# Patient Record
Sex: Male | Born: 1945 | Race: White | Hispanic: No | Marital: Single | Smoking: Former smoker
Health system: Southern US, Community
[De-identification: ages and names within clinical notes are randomized; demographics above are authoritative.]

## PROBLEM LIST (undated history)

## (undated) DIAGNOSIS — Z720 Tobacco use: Secondary | ICD-10-CM

## (undated) DIAGNOSIS — IMO0002 Reserved for concepts with insufficient information to code with codable children: Secondary | ICD-10-CM

## (undated) DIAGNOSIS — D696 Thrombocytopenia, unspecified: Secondary | ICD-10-CM

## (undated) DIAGNOSIS — I69354 Hemiplegia and hemiparesis following cerebral infarction affecting left non-dominant side: Secondary | ICD-10-CM

## (undated) DIAGNOSIS — I69991 Dysphagia following unspecified cerebrovascular disease: Secondary | ICD-10-CM

## (undated) DIAGNOSIS — I639 Cerebral infarction, unspecified: Secondary | ICD-10-CM

## (undated) DIAGNOSIS — F1010 Alcohol abuse, uncomplicated: Secondary | ICD-10-CM

## (undated) DIAGNOSIS — R531 Weakness: Secondary | ICD-10-CM

## (undated) DIAGNOSIS — W19XXXA Unspecified fall, initial encounter: Secondary | ICD-10-CM

## (undated) DIAGNOSIS — E785 Hyperlipidemia, unspecified: Secondary | ICD-10-CM

## (undated) DIAGNOSIS — J449 Chronic obstructive pulmonary disease, unspecified: Secondary | ICD-10-CM

## (undated) DIAGNOSIS — E876 Hypokalemia: Secondary | ICD-10-CM

## (undated) HISTORY — DX: Hypokalemia: E87.6

## (undated) HISTORY — DX: Tobacco use: Z72.0

## (undated) HISTORY — DX: Alcohol abuse, uncomplicated: F10.10

## (undated) HISTORY — DX: Chronic obstructive pulmonary disease, unspecified: J44.9

## (undated) HISTORY — DX: Thrombocytopenia, unspecified: D69.6

## (undated) HISTORY — DX: Unspecified fall, initial encounter: W19.XXXA

## (undated) HISTORY — PX: FINGER AMPUTATION: SHX636

## (undated) HISTORY — PX: FOOT SURGERY: SHX648

## (undated) HISTORY — DX: Dysphagia following unspecified cerebrovascular disease: I69.991

## (undated) HISTORY — DX: Hemiplegia and hemiparesis following cerebral infarction affecting left non-dominant side: I69.354

## (undated) HISTORY — DX: Reserved for concepts with insufficient information to code with codable children: IMO0002

## (undated) HISTORY — DX: Cerebral infarction, unspecified: I63.9

## (undated) HISTORY — DX: Weakness: R53.1

## (undated) HISTORY — DX: Hyperlipidemia, unspecified: E78.5

---

## 2002-06-20 ENCOUNTER — Encounter: Payer: Self-pay | Admitting: *Deleted

## 2002-06-20 ENCOUNTER — Emergency Department (HOSPITAL_COMMUNITY): Admission: EM | Admit: 2002-06-20 | Discharge: 2002-06-20 | Payer: Self-pay | Admitting: *Deleted

## 2002-06-29 ENCOUNTER — Emergency Department (HOSPITAL_COMMUNITY): Admission: EM | Admit: 2002-06-29 | Discharge: 2002-06-29 | Payer: Self-pay | Admitting: Emergency Medicine

## 2004-08-18 ENCOUNTER — Inpatient Hospital Stay (HOSPITAL_COMMUNITY): Admission: EM | Admit: 2004-08-18 | Discharge: 2004-08-22 | Payer: Self-pay | Admitting: Emergency Medicine

## 2006-05-24 ENCOUNTER — Emergency Department (HOSPITAL_COMMUNITY): Admission: EM | Admit: 2006-05-24 | Discharge: 2006-05-24 | Payer: Self-pay | Admitting: Emergency Medicine

## 2006-10-17 ENCOUNTER — Emergency Department (HOSPITAL_COMMUNITY): Admission: EM | Admit: 2006-10-17 | Discharge: 2006-10-17 | Payer: Self-pay | Admitting: Family Medicine

## 2014-08-02 ENCOUNTER — Ambulatory Visit: Payer: Self-pay | Admitting: Gastroenterology

## 2014-08-04 LAB — PATHOLOGY REPORT

## 2015-10-19 ENCOUNTER — Emergency Department (HOSPITAL_COMMUNITY): Payer: Medicare HMO

## 2015-10-19 ENCOUNTER — Emergency Department (HOSPITAL_COMMUNITY)
Admission: EM | Admit: 2015-10-19 | Discharge: 2015-10-19 | Disposition: A | Discharge status: Home / Self Care | Payer: Medicare HMO | Attending: Emergency Medicine | Admitting: Emergency Medicine

## 2015-10-19 ENCOUNTER — Encounter (HOSPITAL_COMMUNITY): Payer: Self-pay

## 2015-10-19 DIAGNOSIS — Z880 Allergy status to penicillin: Secondary | ICD-10-CM | POA: Diagnosis not present

## 2015-10-19 DIAGNOSIS — S0083XA Contusion of other part of head, initial encounter: Secondary | ICD-10-CM | POA: Diagnosis not present

## 2015-10-19 DIAGNOSIS — J441 Chronic obstructive pulmonary disease with (acute) exacerbation: Secondary | ICD-10-CM | POA: Diagnosis not present

## 2015-10-19 DIAGNOSIS — R55 Syncope and collapse: Secondary | ICD-10-CM | POA: Diagnosis not present

## 2015-10-19 DIAGNOSIS — J069 Acute upper respiratory infection, unspecified: Secondary | ICD-10-CM | POA: Diagnosis not present

## 2015-10-19 DIAGNOSIS — W01198A Fall on same level from slipping, tripping and stumbling with subsequent striking against other object, initial encounter: Secondary | ICD-10-CM | POA: Diagnosis not present

## 2015-10-19 DIAGNOSIS — Y9389 Activity, other specified: Secondary | ICD-10-CM | POA: Diagnosis not present

## 2015-10-19 DIAGNOSIS — Y998 Other external cause status: Secondary | ICD-10-CM | POA: Diagnosis not present

## 2015-10-19 DIAGNOSIS — F17210 Nicotine dependence, cigarettes, uncomplicated: Secondary | ICD-10-CM | POA: Diagnosis not present

## 2015-10-19 DIAGNOSIS — R5383 Other fatigue: Secondary | ICD-10-CM | POA: Diagnosis present

## 2015-10-19 DIAGNOSIS — Y92009 Unspecified place in unspecified non-institutional (private) residence as the place of occurrence of the external cause: Secondary | ICD-10-CM | POA: Insufficient documentation

## 2015-10-19 LAB — CBC WITH DIFFERENTIAL/PLATELET
Basophils Absolute: 0 K/uL (ref 0.0–0.1)
Basophils Relative: 0 %
Eosinophils Absolute: 0 K/uL (ref 0.0–0.7)
Eosinophils Relative: 0 %
HCT: 40.8 % (ref 39.0–52.0)
Hemoglobin: 14.9 g/dL (ref 13.0–17.0)
Lymphocytes Relative: 10 %
Lymphs Abs: 0.9 K/uL (ref 0.7–4.0)
MCH: 38.9 pg — ABNORMAL HIGH (ref 26.0–34.0)
MCHC: 36.5 g/dL — ABNORMAL HIGH (ref 30.0–36.0)
MCV: 106.5 fL — ABNORMAL HIGH (ref 78.0–100.0)
Monocytes Absolute: 0.5 K/uL (ref 0.1–1.0)
Monocytes Relative: 6 %
Neutro Abs: 7.5 K/uL (ref 1.7–7.7)
Neutrophils Relative %: 84 %
Platelets: 171 K/uL (ref 150–400)
RBC: 3.83 MIL/uL — ABNORMAL LOW (ref 4.22–5.81)
RDW: 12.5 % (ref 11.5–15.5)
WBC: 8.9 K/uL (ref 4.0–10.5)

## 2015-10-19 LAB — I-STAT TROPONIN, ED: Troponin i, poc: 0.01 ng/mL (ref 0.00–0.08)

## 2015-10-19 LAB — BASIC METABOLIC PANEL WITH GFR
Anion gap: 9 (ref 5–15)
BUN: <5 mg/dL — ABNORMAL LOW (ref 6–20)
CO2: 30 mmol/L (ref 22–32)
Calcium: 8.8 mg/dL — ABNORMAL LOW (ref 8.9–10.3)
Chloride: 87 mmol/L — ABNORMAL LOW (ref 101–111)
Creatinine, Ser: 0.83 mg/dL (ref 0.61–1.24)
GFR calc Af Amer: >60 mL/min
GFR calc non Af Amer: >60 mL/min
Glucose, Bld: 145 mg/dL — ABNORMAL HIGH (ref 65–99)
Potassium: 3.6 mmol/L (ref 3.5–5.1)
Sodium: 126 mmol/L — ABNORMAL LOW (ref 135–145)

## 2015-10-19 MED ORDER — ONDANSETRON HCL 4 MG/2ML IJ SOLN
4.0000 mg | Freq: Once | INTRAMUSCULAR | Status: AC
  Administered 2015-10-19: 4 mg via INTRAVENOUS
  Filled 2015-10-19: qty 2

## 2015-10-19 MED ORDER — AZITHROMYCIN 250 MG PO TABS: 250.0000 mg | ORAL_TABLET | Freq: Every day | ORAL | Status: DC

## 2015-10-19 MED ORDER — ALBUTEROL SULFATE HFA 108 (90 BASE) MCG/ACT IN AERS: 1.0000 | INHALATION_SPRAY | Freq: Four times a day (QID) | RESPIRATORY_TRACT | Status: DC | PRN

## 2015-10-19 MED ORDER — PREDNISONE 10 MG PO TABS: 20.0000 mg | ORAL_TABLET | Freq: Every day | ORAL | Status: DC

## 2015-10-19 MED ORDER — IPRATROPIUM-ALBUTEROL 0.5-2.5 (3) MG/3ML IN SOLN
3.0000 mL | Freq: Once | RESPIRATORY_TRACT | Status: AC
  Administered 2015-10-19: 3 mL via RESPIRATORY_TRACT
  Filled 2015-10-19: qty 3

## 2015-10-19 MED ORDER — SODIUM CHLORIDE 0.9 % IV BOLUS (SEPSIS)
1000.0000 mL | Freq: Once | INTRAVENOUS | Status: AC
  Administered 2015-10-19: 1000 mL via INTRAVENOUS

## 2015-10-19 NOTE — Discharge Instructions (Signed)
Upper Respiratory Infection, Adult Most upper respiratory infections (URIs) are a viral infection of the air passages leading to the lungs. A URI affects the nose, throat, and upper air passages. The most common type of URI is nasopharyngitis and is typically referred to as "the common cold." URIs run their course and usually go away on their own. Most of the time, a URI does not require medical attention, but sometimes a bacterial infection in the upper airways can follow a viral infection. This is called a secondary infection. Sinus and middle ear infections are common types of secondary upper respiratory infections. Bacterial pneumonia can also complicate a URI. A URI can worsen asthma and chronic obstructive pulmonary disease (COPD). Sometimes, these complications can require emergency medical care and may be life threatening.  CAUSES Almost all URIs are caused by viruses. A virus is a type of germ and can spread from one person to another.  RISKS FACTORS You may be at risk for a URI if:   You smoke.   You have chronic heart or lung disease.  You have a weakened defense (immune) system.   You are very young or very old.   You have nasal allergies or asthma.  You work in crowded or poorly ventilated areas.  You work in health care facilities or schools. SIGNS AND SYMPTOMS  Symptoms typically develop 2-3 days after you come in contact with a cold virus. Most viral URIs last 7-10 days. However, viral URIs from the influenza virus (flu virus) can last 14-18 days and are typically more severe. Symptoms may include:   Runny or stuffy (congested) nose.   Sneezing.   Cough.   Sore throat.   Headache.   Fatigue.   Fever.   Loss of appetite.   Pain in your forehead, behind your eyes, and over your cheekbones (sinus pain).  Muscle aches.  DIAGNOSIS  Your health care provider may diagnose a URI by:  Physical exam.  Tests to check that your symptoms are not due to  another condition such as:  Strep throat.  Sinusitis.  Pneumonia.  Asthma. TREATMENT  A URI goes away on its own with time. It cannot be cured with medicines, but medicines may be prescribed or recommended to relieve symptoms. Medicines may help:  Reduce your fever.  Reduce your cough.  Relieve nasal congestion. HOME CARE INSTRUCTIONS   Take medicines only as directed by your health care provider.   Gargle warm saltwater or take cough drops to comfort your throat as directed by your health care provider.  Use a warm mist humidifier or inhale steam from a shower to increase air moisture. This may make it easier to breathe.  Drink enough fluid to keep your urine clear or pale yellow.   Eat soups and other clear broths and maintain good nutrition.   Rest as needed.   Return to work when your temperature has returned to normal or as your health care provider advises. You may need to stay home longer to avoid infecting others. You can also use a face mask and careful hand washing to prevent spread of the virus.  Increase the usage of your inhaler if you have asthma.   Do not use any tobacco products, including cigarettes, chewing tobacco, or electronic cigarettes. If you need help quitting, ask your health care provider. PREVENTION  The best way to protect yourself from getting a cold is to practice good hygiene.   Avoid oral or hand contact with people with cold  symptoms.   Wash your hands often if contact occurs.  There is no clear evidence that vitamin C, vitamin E, echinacea, or exercise reduces the chance of developing a cold. However, it is always recommended to get plenty of rest, exercise, and practice good nutrition.  SEEK MEDICAL CARE IF:   You are getting worse rather than better.   Your symptoms are not controlled by medicine.   You have chills.  You have worsening shortness of breath.  You have brown or red mucus.  You have yellow or brown nasal  discharge.  You have pain in your face, especially when you bend forward.  You have a fever.  You have swollen neck glands.  You have pain while swallowing.  You have white areas in the back of your throat. SEEK IMMEDIATE MEDICAL CARE IF:   You have severe or persistent:  Headache.  Ear pain.  Sinus pain.  Chest pain.  You have chronic lung disease and any of the following:  Wheezing.  Prolonged cough.  Coughing up blood.  A change in your usual mucus.  You have a stiff neck.  You have changes in your:  Vision.  Hearing.  Thinking.  Mood. MAKE SURE YOU:   Understand these instructions.  Will watch your condition.  Will get help right away if you are not doing well or get worse.   This information is not intended to replace advice given to you by your health care provider. Make sure you discuss any questions you have with your health care provider.   Document Released: 04/24/2001 Document Revised: 03/15/2015 Document Reviewed: 02/03/2014 Elsevier Interactive Patient Education 2016 [employer]  Cough, Adult A cough helps to clear your throat and lungs. A cough may last only 2-3 weeks (acute), or it may last longer than 8 weeks (chronic). Many different things can cause a cough. A cough may be a sign of an illness or another medical condition. HOME CARE  Pay attention to any changes in your cough.  Take medicines only as told by your doctor.  If you were prescribed an antibiotic medicine, take it as told by your doctor. Do not stop taking it even if you start to feel better.  Talk with your doctor before you try using a cough medicine.  Drink enough fluid to keep your pee (urine) clear or pale yellow.  If the air is dry, use a cold steam vaporizer or humidifier in your home.  Stay away from things that make you cough at work or at home.  If your cough is worse at night, try using extra pillows to raise your head up higher while you  sleep.  Do not smoke, and try not to be around smoke. If you need help quitting, ask your doctor.  Do not have caffeine.  Do not drink alcohol.  Rest as needed. GET HELP IF:  You have new problems (symptoms).  You cough up yellow fluid (pus).  Your cough does not get better after 2-3 weeks, or your cough gets worse.  Medicine does not help your cough and you are not sleeping well.  You have pain that gets worse or pain that is not helped with medicine.  You have a fever.  You are losing weight and you do not know why.  You have night sweats. GET HELP RIGHT AWAY IF:  You cough up blood.  You have trouble breathing.  Your heartbeat is very fast.   This information is not intended to replace  advice given to you by your health care provider. Make sure you discuss any questions you have with your health care provider.   Take antibiotics as prescribed. Avoid use of alcohol while taking antibiotics. Return to the Emergency Department if you experience worsening of your symptoms, loss of consciousness, chest pain, difficulty breathing.

## 2015-10-19 NOTE — ED Notes (Signed)
Pt arrived via [facility] from home c/o generalized weakness for the last 2 weeks.  N/V/D x2 days. Productive cough x 3days.  Pt was dizzy and fell at home yesterday no LOC but has small lac and bruising of right eye.  Pt endorses ETOH daly.

## 2015-10-19 NOTE — ED Notes (Signed)
Patient's O2 saturation remained at 100% during ambulation in hallway.

## 2015-10-19 NOTE — ED Provider Notes (Signed)
CSN: [id number]     Arrival date & time 10/19/15  1545 History   First MD Initiated Contact with Patient 10/19/15 1547     Chief Complaint  Patient presents with  . Fatigue  . Nausea     (Consider location/radiation/quality/duration/timing/severity/associated sxs/prior Treatment) HPI  Tyler Pierce is a 69 y.o M with a pmhx of COPD, EtOH abuse who presents to the ED today c/o generalized weakness, SOB and syncope. Pt states that he has not felt well over the last 2 weeks. Symptoms include productive cough with yellow sputum, DOE, and intermittent dizziness. No fever or chills. Pt states that last night while he was standing he became very dizzy and passed out. Pt hit his head no the floor and now has a hematoma above his R eye. Pt states that he was passed out for 1 minute before he regained consciousness. Denies current headache or confusion. Pt is not on blood thinners. Pt states that since the syncopal episode he has been very dizzy and feels off balance to the point where he can't walk. Dizziness is positional, increased with sitting up and looking around. Pt has also had associated nausea with 1 episoe of NBNB vomiting. Denies chest pain, abdominal pain.   History reviewed. No pertinent past medical history. Past Surgical History  Procedure Laterality Date  . Finger amputation     History reviewed. No pertinent family history. Social History  Substance Use Topics  . Smoking status: Current Every Day Smoker -- 1.00 packs/day    Types: Cigarettes  . Smokeless tobacco: Never Used  . Alcohol Use: Yes    Review of Systems  All other systems reviewed and are negative.     Allergies  Penicillins  Home Medications   Prior to Admission medications   Not on File   BP 124/71 mmHg  Pulse 70  Temp(Src) 98.4 F (36.9 C) (Oral)  Resp 18  SpO2 90% Physical Exam  Constitutional: He is oriented to person, place, and time. He appears well-developed and well-nourished. No distress.    HENT:  Head: Normocephalic. Head is with contusion.    Mouth/Throat: Oropharynx is clear and moist. No oropharyngeal exudate.  Hematoma present above R eye. No nystagmus. EOMs intact. No pain with eye movement. No tenderness of orbits. No raccoon eyes or battle sign.  Eyes: Conjunctivae and EOM are normal. Pupils are equal, round, and reactive to light. Right eye exhibits no discharge. Left eye exhibits no discharge. No scleral icterus.  Neck: Neck supple. No JVD present.  Cardiovascular: Normal rate, regular rhythm, normal heart sounds and intact distal pulses.  Exam reveals no gallop and no friction rub.   No murmur heard. Pulmonary/Chest: Effort normal. No respiratory distress. He has wheezes ( wheezes apreciated in bilateral lung bases). He has no rales. He exhibits no tenderness.  Abdominal: Soft. Bowel sounds are normal. He exhibits no distension. There is no tenderness. There is no guarding.  Musculoskeletal: Normal range of motion. He exhibits no edema or tenderness.  Lymphadenopathy:    He has no cervical adenopathy.  Neurological: He is alert and oriented to person, place, and time. No cranial nerve deficit. He exhibits normal muscle tone. Coordination normal.  Strength 5/5 throughout. No sensory deficits.  No gait abnormality.  Skin: Skin is warm and dry. No rash noted. He is not diaphoretic. No erythema. No pallor.  Psychiatric:  Strange affect.  Nursing note and vitals reviewed.   ED Course  Procedures (including critical care time) Labs Review Labs  Reviewed  BASIC METABOLIC PANEL - Abnormal; Notable for the following:    Sodium 126 (*)    Chloride 87 (*)    Glucose, Bld 145 (*)    BUN <5 (*)    Calcium 8.8 (*)    All other components within normal limits  CBC WITH DIFFERENTIAL/PLATELET - Abnormal; Notable for the following:    RBC 3.83 (*)    MCV 106.5 (*)    MCH 38.9 (*)    MCHC 36.5 (*)    All other components within normal limits  I-STAT TROPOININ, ED     Imaging Review Dg Chest 2 View  10/19/2015  CLINICAL DATA:  Cough and chest congestion. Shortness breath. Chest pain. Weakness. COPD. EXAM: CHEST  2 VIEW COMPARISON:  10/17/2006 FINDINGS: The heart size and mediastinal contours are within normal limits. Both lungs are clear. Pulmonary hyperinflation again seen, consistent with COPD. No evidence of pneumothorax or pleural effusion. The visualized skeletal structures are unremarkable. IMPRESSION: COPD.  No active cardiopulmonary disease. Electronically Signed   By: Earle Gell M.D.   On: 10/19/2015 17:11   Ct Head Wo Contrast  10/19/2015  CLINICAL DATA:  Dizziness with fall.  Pain after fall EXAM: CT HEAD WITHOUT CONTRAST CT CERVICAL SPINE WITHOUT CONTRAST TECHNIQUE: Multidetector CT imaging of the head and cervical spine was performed following the standard protocol without intravenous contrast. Multiplanar CT image reconstructions of the cervical spine were also generated. COMPARISON:  None. FINDINGS: CT HEAD FINDINGS There is mild diffuse atrophy with slightly greater parietal lobe atrophy than elsewhere. There is no intracranial mass, hemorrhage, extra-axial fluid collection, or midline shift. There is small vessel disease throughout the centra semiovale bilaterally. There is evidence of a prior focal lacunar infarct in the inferior mid left centrum semiovale. There is evidence of an age uncertain small infarct in the anterior limb of the left external capsule. The bony calvarium appears intact. The mastoid air cells are clear. There is mucosal thickening in both maxillary antra as well as in multiple ethmoid air cells, the inferior frontal sinuses bilaterally, and the right sphenoid sinus. No intraorbital lesions are identified. CT CERVICAL SPINE FINDINGS There is no fracture or spondylolisthesis. Prevertebral soft tissues and predental space regions are normal. There is moderate disc space narrowing at C3-4, C4-5, C5-6, and C6-7. There are anterior  osteophytes at C3, C4, C5, and C6. There is facet hypertrophy at multiple levels. Exit foraminal narrowing is noted at C3-4, C4-5, C5-6, and C6-7 bilaterally. No disc extrusion or high-grade stenosis. There are scattered foci of coronary artery calcification bilaterally. There is bullous disease in each lung apex. IMPRESSION: CT head: Atrophy with extensive periventricular small vessel disease. 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 No hemorrhage or mass effect. Multifocal paranasal sinus disease. CT cervical spine: No fracture or spondylolisthesis. Multilevel osteoarthritic change. Scattered calcification in the carotid arteries bilaterally. Bullous disease in each lung apex. Electronically Signed   By: Lowella Grip III M.D.   On: 10/19/2015 19:52   Ct Cervical Spine Wo Contrast  10/19/2015  CLINICAL DATA:  Dizziness with fall.  Pain after fall EXAM: CT HEAD WITHOUT CONTRAST CT CERVICAL SPINE WITHOUT CONTRAST TECHNIQUE: Multidetector CT imaging of the head and cervical spine was performed following the standard protocol without intravenous contrast. Multiplanar CT image reconstructions of the cervical spine were also generated. COMPARISON:  None. FINDINGS: CT HEAD FINDINGS There is mild diffuse atrophy with slightly greater parietal lobe atrophy than elsewhere. There  is no intracranial mass, hemorrhage, extra-axial fluid collection, or midline shift. There is small vessel disease throughout the centra semiovale bilaterally. There is evidence of a prior focal lacunar infarct in the inferior mid left centrum semiovale. There is evidence of an age uncertain small infarct in the anterior limb of the left external capsule. The bony calvarium appears intact. The mastoid air cells are clear. There is mucosal thickening in both maxillary antra as well as in multiple ethmoid air cells, the inferior frontal sinuses bilaterally, and the right sphenoid sinus. No intraorbital  lesions are identified. CT CERVICAL SPINE FINDINGS There is no fracture or spondylolisthesis. Prevertebral soft tissues and predental space regions are normal. There is moderate disc space narrowing at C3-4, C4-5, C5-6, and C6-7. There are anterior osteophytes at C3, C4, C5, and C6. There is facet hypertrophy at multiple levels. Exit foraminal narrowing is noted at C3-4, C4-5, C5-6, and C6-7 bilaterally. No disc extrusion or high-grade stenosis. There are scattered foci of coronary artery calcification bilaterally. There is bullous disease in each lung apex. IMPRESSION: CT head: Atrophy with extensive periventricular small vessel disease. 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 No hemorrhage or mass effect. Multifocal paranasal sinus disease. CT cervical spine: No fracture or spondylolisthesis. Multilevel osteoarthritic change. Scattered calcification in the carotid arteries bilaterally. Bullous disease in each lung apex. Electronically Signed   By: Lowella Grip III M.D.   On: 10/19/2015 19:52   I have personally reviewed and evaluated these images and lab results as part of my medical decision-making.   EKG Interpretation   Date/Time:  Wednesday October 19 2015 15:47:43 EST Ventricular Rate:  73 PR Interval:  136 QRS Duration: 106 QT Interval:  419 QTC Calculation: 462 R Axis:   56 Text Interpretation:  Sinus rhythm Borderline T abnormalities, anterior  leads No significant change since last tracing Confirmed by Christy Gentles  MD,  Elenore Rota ([id number]) on 10/19/2015 3:52:45 PM      MDM   Final diagnoses:  URI (upper respiratory infection)    69 year old male with past medical history of alcohol abuse, COPD presents for multiple complaints including cough, dizziness and syncope that occurred last night after drinking multiple alcoholic beverages. Patient appears well in ED, nontoxic. Patient with hematoma above right eye. Will CT head and cervical spine due  to increased risk of bleeding with alcohol use and age. Patient is not on blood thinners. Wheezes present on lung exam. We'll administer breathing treatment and obtain chest x-ray.  Sodium is low at 126. Suspect this is due to dehydration. Fluids administered. Patient reports symptomatic improvement after breathing treatment. Orthostatic vital signs within normal limits. Patient ambulate about in the emergency department and pulse ox remained at 100%. Was not symptomatic or dizzy while walking. No ataxia or gait disturbance observed. Chest x-ray reveals changes associated with COPD.  CT head and cervical spine negative for hemorrhage or acute abnormality. All other lab values within normal limits. EKG unremarkable. Initial troponin normal.  Suspect symptoms are due to bronchitis. We will discharge home with antibiotics, steroids and albuterol inhaler. Discussed treatment plan with patient who is agreeable. Return precautions outlined in patient discharge instructions.     Dondra Spry [place], PA-C 10/20/15 1803  Milton Ferguson, MD 10/20/15 [phone]

## 2015-10-19 NOTE — ED Notes (Signed)
Pt stable, ambulatory, states understanding of discharge instructions 

## 2015-11-05 ENCOUNTER — Inpatient Hospital Stay (HOSPITAL_COMMUNITY)
Admission: EM | Admit: 2015-11-05 | Discharge: 2015-11-09 | DRG: 065 | Disposition: A | Discharge status: Skilled Nursing Facility | Payer: Medicare HMO | Attending: Internal Medicine | Admitting: Internal Medicine

## 2015-11-05 DIAGNOSIS — R531 Weakness: Secondary | ICD-10-CM | POA: Diagnosis present

## 2015-11-05 DIAGNOSIS — Z880 Allergy status to penicillin: Secondary | ICD-10-CM

## 2015-11-05 DIAGNOSIS — I639 Cerebral infarction, unspecified: Secondary | ICD-10-CM | POA: Diagnosis not present

## 2015-11-05 DIAGNOSIS — G8194 Hemiplegia, unspecified affecting left nondominant side: Secondary | ICD-10-CM | POA: Diagnosis present

## 2015-11-05 DIAGNOSIS — E785 Hyperlipidemia, unspecified: Secondary | ICD-10-CM | POA: Diagnosis present

## 2015-11-05 DIAGNOSIS — Z590 Homelessness: Secondary | ICD-10-CM

## 2015-11-05 DIAGNOSIS — D696 Thrombocytopenia, unspecified: Secondary | ICD-10-CM | POA: Diagnosis present

## 2015-11-05 DIAGNOSIS — I69354 Hemiplegia and hemiparesis following cerebral infarction affecting left non-dominant side: Secondary | ICD-10-CM | POA: Insufficient documentation

## 2015-11-05 DIAGNOSIS — E876 Hypokalemia: Secondary | ICD-10-CM | POA: Diagnosis present

## 2015-11-05 DIAGNOSIS — K769 Liver disease, unspecified: Secondary | ICD-10-CM | POA: Diagnosis present

## 2015-11-05 DIAGNOSIS — IMO0002 Reserved for concepts with insufficient information to code with codable children: Secondary | ICD-10-CM | POA: Insufficient documentation

## 2015-11-05 DIAGNOSIS — I10 Essential (primary) hypertension: Secondary | ICD-10-CM | POA: Diagnosis present

## 2015-11-05 DIAGNOSIS — R29810 Facial weakness: Secondary | ICD-10-CM | POA: Diagnosis present

## 2015-11-05 DIAGNOSIS — I69991 Dysphagia following unspecified cerebrovascular disease: Secondary | ICD-10-CM | POA: Insufficient documentation

## 2015-11-05 DIAGNOSIS — F1010 Alcohol abuse, uncomplicated: Secondary | ICD-10-CM | POA: Diagnosis present

## 2015-11-05 DIAGNOSIS — W19XXXA Unspecified fall, initial encounter: Secondary | ICD-10-CM | POA: Diagnosis present

## 2015-11-05 DIAGNOSIS — Z720 Tobacco use: Secondary | ICD-10-CM | POA: Insufficient documentation

## 2015-11-05 DIAGNOSIS — R079 Chest pain, unspecified: Secondary | ICD-10-CM

## 2015-11-05 DIAGNOSIS — M6289 Other specified disorders of muscle: Secondary | ICD-10-CM | POA: Diagnosis not present

## 2015-11-05 DIAGNOSIS — R1310 Dysphagia, unspecified: Secondary | ICD-10-CM | POA: Diagnosis present

## 2015-11-05 DIAGNOSIS — F17210 Nicotine dependence, cigarettes, uncomplicated: Secondary | ICD-10-CM | POA: Diagnosis present

## 2015-11-05 DIAGNOSIS — I63311 Cerebral infarction due to thrombosis of right middle cerebral artery: Secondary | ICD-10-CM

## 2015-11-05 DIAGNOSIS — R471 Dysarthria and anarthria: Secondary | ICD-10-CM | POA: Diagnosis present

## 2015-11-06 ENCOUNTER — Inpatient Hospital Stay (HOSPITAL_COMMUNITY): Payer: Medicare HMO

## 2015-11-06 ENCOUNTER — Encounter (HOSPITAL_COMMUNITY): Payer: Self-pay | Admitting: *Deleted

## 2015-11-06 ENCOUNTER — Emergency Department (HOSPITAL_COMMUNITY): Payer: Medicare HMO

## 2015-11-06 DIAGNOSIS — M6389 Disorders of muscle in diseases classified elsewhere, multiple sites: Secondary | ICD-10-CM

## 2015-11-06 DIAGNOSIS — Z880 Allergy status to penicillin: Secondary | ICD-10-CM | POA: Diagnosis not present

## 2015-11-06 DIAGNOSIS — Z590 Homelessness: Secondary | ICD-10-CM | POA: Diagnosis not present

## 2015-11-06 DIAGNOSIS — I10 Essential (primary) hypertension: Secondary | ICD-10-CM | POA: Diagnosis present

## 2015-11-06 DIAGNOSIS — R29810 Facial weakness: Secondary | ICD-10-CM | POA: Diagnosis present

## 2015-11-06 DIAGNOSIS — D696 Thrombocytopenia, unspecified: Secondary | ICD-10-CM | POA: Diagnosis not present

## 2015-11-06 DIAGNOSIS — F1010 Alcohol abuse, uncomplicated: Secondary | ICD-10-CM

## 2015-11-06 DIAGNOSIS — I639 Cerebral infarction, unspecified: Secondary | ICD-10-CM | POA: Diagnosis present

## 2015-11-06 DIAGNOSIS — F17210 Nicotine dependence, cigarettes, uncomplicated: Secondary | ICD-10-CM | POA: Diagnosis present

## 2015-11-06 DIAGNOSIS — K769 Liver disease, unspecified: Secondary | ICD-10-CM | POA: Diagnosis present

## 2015-11-06 DIAGNOSIS — E876 Hypokalemia: Secondary | ICD-10-CM | POA: Diagnosis present

## 2015-11-06 DIAGNOSIS — G8194 Hemiplegia, unspecified affecting left nondominant side: Secondary | ICD-10-CM | POA: Diagnosis present

## 2015-11-06 DIAGNOSIS — W19XXXA Unspecified fall, initial encounter: Secondary | ICD-10-CM | POA: Diagnosis not present

## 2015-11-06 DIAGNOSIS — I63032 Cerebral infarction due to thrombosis of left carotid artery: Secondary | ICD-10-CM

## 2015-11-06 DIAGNOSIS — M6289 Other specified disorders of muscle: Secondary | ICD-10-CM | POA: Diagnosis present

## 2015-11-06 DIAGNOSIS — I6789 Other cerebrovascular disease: Secondary | ICD-10-CM

## 2015-11-06 DIAGNOSIS — I69991 Dysphagia following unspecified cerebrovascular disease: Secondary | ICD-10-CM | POA: Diagnosis not present

## 2015-11-06 DIAGNOSIS — I63311 Cerebral infarction due to thrombosis of right middle cerebral artery: Secondary | ICD-10-CM | POA: Diagnosis not present

## 2015-11-06 DIAGNOSIS — Z720 Tobacco use: Secondary | ICD-10-CM | POA: Diagnosis not present

## 2015-11-06 DIAGNOSIS — E785 Hyperlipidemia, unspecified: Secondary | ICD-10-CM | POA: Diagnosis not present

## 2015-11-06 DIAGNOSIS — R531 Weakness: Secondary | ICD-10-CM | POA: Diagnosis present

## 2015-11-06 DIAGNOSIS — I69354 Hemiplegia and hemiparesis following cerebral infarction affecting left non-dominant side: Secondary | ICD-10-CM | POA: Diagnosis not present

## 2015-11-06 DIAGNOSIS — R1310 Dysphagia, unspecified: Secondary | ICD-10-CM | POA: Diagnosis present

## 2015-11-06 DIAGNOSIS — R471 Dysarthria and anarthria: Secondary | ICD-10-CM | POA: Diagnosis present

## 2015-11-06 HISTORY — DX: Unspecified fall, initial encounter: W19.XXXA

## 2015-11-06 HISTORY — DX: Thrombocytopenia, unspecified: D69.6

## 2015-11-06 HISTORY — DX: Cerebral infarction, unspecified: I63.9

## 2015-11-06 HISTORY — DX: Alcohol abuse, uncomplicated: F10.10

## 2015-11-06 HISTORY — DX: Hypokalemia: E87.6

## 2015-11-06 LAB — PROTIME-INR
INR: 1.17 (ref 0.00–1.49)
Prothrombin Time: 15.1 seconds (ref 11.6–15.2)

## 2015-11-06 LAB — COMPREHENSIVE METABOLIC PANEL
ALK PHOS: 54 U/L (ref 38–126)
ALT: 25 U/L (ref 17–63)
AST: 29 U/L (ref 15–41)
Albumin: 3 g/dL — ABNORMAL LOW (ref 3.5–5.0)
Anion gap: 10 (ref 5–15)
BUN: 10 mg/dL (ref 6–20)
CALCIUM: 8.5 mg/dL — AB (ref 8.9–10.3)
CO2: 25 mmol/L (ref 22–32)
CREATININE: 0.99 mg/dL (ref 0.61–1.24)
Chloride: 100 mmol/L — ABNORMAL LOW (ref 101–111)
GFR calc non Af Amer: >60 mL/min (ref >60)
Glucose, Bld: 96 mg/dL (ref 65–99)
Potassium: 3.4 mmol/L — ABNORMAL LOW (ref 3.5–5.1)
SODIUM: 135 mmol/L (ref 135–145)
Total Bilirubin: 2.1 mg/dL — ABNORMAL HIGH (ref 0.3–1.2)
Total Protein: 5.7 g/dL — ABNORMAL LOW (ref 6.5–8.1)

## 2015-11-06 LAB — CBC
HEMATOCRIT: 36.6 % — AB (ref 39.0–52.0)
HEMOGLOBIN: 12.9 g/dL — AB (ref 13.0–17.0)
MCH: 38.4 pg — ABNORMAL HIGH (ref 26.0–34.0)
MCHC: 35.2 g/dL (ref 30.0–36.0)
MCV: 108.9 fL — AB (ref 78.0–100.0)
PLATELETS: 122 10*3/uL — AB (ref 150–400)
RBC: 3.36 MIL/uL — AB (ref 4.22–5.81)
RDW: 12.7 % (ref 11.5–15.5)
WBC: 6.4 10*3/uL (ref 4.0–10.5)

## 2015-11-06 LAB — RAPID URINE DRUG SCREEN, HOSP PERFORMED
Amphetamines: NOT DETECTED
BENZODIAZEPINES: NOT DETECTED
Barbiturates: NOT DETECTED
COCAINE: NOT DETECTED
OPIATES: NOT DETECTED
Tetrahydrocannabinol: NOT DETECTED

## 2015-11-06 LAB — I-STAT CHEM 8, ED
BUN: 11 mg/dL (ref 6–20)
CALCIUM ION: 1.03 mmol/L — AB (ref 1.13–1.30)
CHLORIDE: 97 mmol/L — AB (ref 101–111)
CREATININE: 0.9 mg/dL (ref 0.61–1.24)
GLUCOSE: 92 mg/dL (ref 65–99)
HCT: 41 % (ref 39.0–52.0)
Hemoglobin: 13.9 g/dL (ref 13.0–17.0)
Potassium: 3.2 mmol/L — ABNORMAL LOW (ref 3.5–5.1)
Sodium: 136 mmol/L (ref 135–145)
TCO2: 26 mmol/L (ref 0–100)

## 2015-11-06 LAB — URINALYSIS, ROUTINE W REFLEX MICROSCOPIC
GLUCOSE, UA: NEGATIVE mg/dL
HGB URINE DIPSTICK: NEGATIVE
Ketones, ur: 15 mg/dL — AB
Nitrite: POSITIVE — AB
PH: 6 (ref 5.0–8.0)
Protein, ur: NEGATIVE mg/dL
SPECIFIC GRAVITY, URINE: 1.024 (ref 1.005–1.030)

## 2015-11-06 LAB — LIPID PANEL
Cholesterol: 124 mg/dL (ref 0–200)
HDL: 37 mg/dL — AB (ref >40)
LDL CALC: 73 mg/dL (ref 0–99)
TRIGLYCERIDES: 69 mg/dL (ref <150)
Total CHOL/HDL Ratio: 3.4 RATIO
VLDL: 14 mg/dL (ref 0–40)

## 2015-11-06 LAB — URINE MICROSCOPIC-ADD ON

## 2015-11-06 LAB — I-STAT TROPONIN, ED: Troponin i, poc: 0.01 ng/mL (ref 0.00–0.08)

## 2015-11-06 LAB — DIFFERENTIAL
Basophils Absolute: 0 10*3/uL (ref 0.0–0.1)
Basophils Relative: 0 %
Eosinophils Absolute: 0.1 10*3/uL (ref 0.0–0.7)
Eosinophils Relative: 2 %
LYMPHS PCT: 30 %
Lymphs Abs: 1.9 10*3/uL (ref 0.7–4.0)
MONO ABS: 0.5 10*3/uL (ref 0.1–1.0)
MONOS PCT: 8 %
NEUTROS ABS: 3.8 10*3/uL (ref 1.7–7.7)
Neutrophils Relative %: 60 %

## 2015-11-06 LAB — APTT: aPTT: 28 seconds (ref 24–37)

## 2015-11-06 LAB — ETHANOL: Alcohol, Ethyl (B): <5 mg/dL (ref <5)

## 2015-11-06 MED ORDER — POTASSIUM CHLORIDE CRYS ER 20 MEQ PO TBCR: 40.0000 meq | EXTENDED_RELEASE_TABLET | Freq: Once | ORAL | Status: DC

## 2015-11-06 MED ORDER — ENOXAPARIN SODIUM 40 MG/0.4ML ~~LOC~~ SOLN
40.0000 mg | Freq: Every day | SUBCUTANEOUS | Status: DC
  Administered 2015-11-06 – 2015-11-09 (×4): 40 mg via SUBCUTANEOUS
  Filled 2015-11-06 (×4): qty 0.4

## 2015-11-06 MED ORDER — POTASSIUM CHLORIDE IN NACL 20-0.9 MEQ/L-% IV SOLN
INTRAVENOUS | Status: DC
  Administered 2015-11-06 – 2015-11-09 (×3): via INTRAVENOUS
  Filled 2015-11-06 (×4): qty 1000

## 2015-11-06 MED ORDER — SODIUM CHLORIDE 0.9 % IV SOLN
INTRAVENOUS | Status: DC
  Administered 2015-11-06: 02:00:00 via INTRAVENOUS

## 2015-11-06 MED ORDER — IPRATROPIUM-ALBUTEROL 0.5-2.5 (3) MG/3ML IN SOLN
3.0000 mL | Freq: Once | RESPIRATORY_TRACT | Status: AC
  Administered 2015-11-06: 3 mL via RESPIRATORY_TRACT
  Filled 2015-11-06: qty 3

## 2015-11-06 MED ORDER — STROKE: EARLY STAGES OF RECOVERY BOOK
Freq: Once | Status: AC
  Administered 2015-11-06: 06:00:00

## 2015-11-06 MED ORDER — GUAIFENESIN-DM 100-10 MG/5ML PO SYRP
5.0000 mL | ORAL_SOLUTION | ORAL | Status: DC | PRN
  Administered 2015-11-08: 5 mL via ORAL
  Filled 2015-11-06: qty 5

## 2015-11-06 MED ORDER — ASPIRIN EC 325 MG PO TBEC: 325.0000 mg | DELAYED_RELEASE_TABLET | Freq: Every day | ORAL | Status: DC

## 2015-11-06 MED ORDER — ASPIRIN 300 MG RE SUPP
300.0000 mg | Freq: Every day | RECTAL | Status: DC
  Administered 2015-11-06 – 2015-11-07 (×2): 300 mg via RECTAL
  Filled 2015-11-06 (×2): qty 1

## 2015-11-06 MED ORDER — SENNOSIDES-DOCUSATE SODIUM 8.6-50 MG PO TABS
1.0000 | ORAL_TABLET | Freq: Every evening | ORAL | Status: DC | PRN
Start: 2015-11-06 — End: 2015-11-09

## 2015-11-06 NOTE — Plan of Care (Signed)
Problem: SLP Dysphagia Goals Goal: Patient will demonstrate readiness for PO's Patient will demonstrate readiness for PO's and/or instrumental swallow study as evidenced by: As evidenced by ability to consume at least one po texture without overt indication of aspiration with mod assist for use of strategies.

## 2015-11-06 NOTE — Progress Notes (Signed)
*  PRELIMINARY RESULTS* Vascular Ultrasound Carotid Duplex (Doppler) has been completed.  Findings suggest 1-39% internal carotid artery stenosis bilaterally. The left vertebral artery is patent with antegrade flow. Unable to visualize the right vertebral artery.  11/06/2015 9:18 AM Maudry Mayhew, RVT, RDCS, RDMS

## 2015-11-06 NOTE — Consult Note (Signed)
Stroke Consult Consulting Physician: Dr Leonides Schanz ED  Chief Complaint: weakness HPI: Tyler Pierce is an 69 y.o. male with no reported past medical history presenting with sudden onset of left sided weakness. LSW 0500 on 12/24. Involved face, arm and leg, notes having a faill due to the weakness. Reports being on the floor for 4 hours after the fall due to weakness. Notes no prior CVA or TIA. Not on a daily antiplatelet. Patient is homeless.   Date last known well: 12/24 Time last known well: 0500 tPA Given: No: outside tPA window Modified Rankin: Rankin Score=0  History reviewed. No pertinent past medical history.  Past Surgical History  Procedure Laterality Date  . Finger amputation      History reviewed. No pertinent family history. Social History:  reports that he has been smoking Cigarettes.  He has been smoking about 1.00 pack per day. He has never used smokeless tobacco. He reports that he drinks alcohol. He reports that he does not use illicit drugs.  Allergies:  Allergies  Allergen Reactions  . Penicillins     Loss of consciousness  Has patient had a PCN reaction causing immediate rash, facial/tongue/throat swelling, SOB or lightheadedness with hypotension: Yes Has patient had a PCN reaction causing severe rash involving mucus membranes or skin necrosis: No Has patient had a PCN reaction that required hospitalization No Has patient had a PCN reaction occurring within the last 10 years: No If all of the above answers are "NO", then may proceed with Cephalosporin use.     (Not in a hospital admission)  ROS: Out of a complete 14 system review, the patient complains of only the following symptoms, and all other reviewed systems are negative. +left side weakness  Physical Examination: Filed Vitals:   11/06/15 0133 11/06/15 0137  BP: 128/68   Pulse: 63   Temp:  98.2 F (36.8 C)  Resp: 13    Physical Exam  Constitutional: He appears well-developed and well-nourished.   Psych: Affect appropriate to situation Eyes: No scleral injection HENT: No OP obstrucion Head: Normocephalic.  Cardiovascular: Normal rate and regular rhythm.  Respiratory: Effort normal and breath sounds normal.  GI: Soft. Bowel sounds are normal. No distension. There is no tenderness.  Skin: WDI   Neurologic Examination: Mental Status: Alert, oriented, thought content appropriate.  Speech fluent without evidence of aphasia. Mild dysarthria. Able to follow 3 step commands without difficulty. Cranial Nerves: II: funduscopic exam wnl bilaterally, visual fields grossly normal, pupils equal, round, reactive to light and accommodation III,IV, VI: ptosis not present, extra-ocular motions intact bilaterally V,VII: mild flattening L NLF, facial light touch sensation normal bilaterally VIII: hearing normal bilaterally IX,X: gag reflex present XI: trapezius strength/neck flexion strength normal bilaterally XII: tongue strength normal  Motor: Drift of LUE LUE proximal 4+/5, distal 5-/5 LLE 5-/5 proximal and distal RUE and RLE strength intac Sensory: diminished LT LLE Deep Tendon Reflexes: 2+ and symmetric throughout Plantars: Right: downgoing   Left: downgoing Cerebellar: Difficulty with FTN and HTS on the left Gait: deferred  Laboratory Studies:   Basic Metabolic Panel:  Recent Labs Lab 11/06/15 0028 11/06/15 0036  NA 135 136  K 3.4* 3.2*  CL 100* 97*  CO2 25  --   GLUCOSE 96 92  BUN 10 11  CREATININE 0.99 0.90  CALCIUM 8.5*  --     Liver Function Tests:  Recent Labs Lab 11/06/15 0028  AST 29  ALT 25  ALKPHOS 54  BILITOT 2.1*  PROT 5.7*  ALBUMIN 3.0*   No results for input(s): LIPASE, AMYLASE in the last 168 hours. No results for input(s): AMMONIA in the last 168 hours.  CBC:  Recent Labs Lab 11/06/15 0028 11/06/15 0036  WBC 6.4  --   NEUTROABS 3.8  --   HGB 12.9* 13.9  HCT 36.6* 41.0  MCV 108.9*  --   PLT 122*  --     Cardiac Enzymes: No  results for input(s): CKTOTAL, CKMB, CKMBINDEX, TROPONINI in the last 168 hours.  BNP: Invalid input(s): POCBNP  CBG: No results for input(s): GLUCAP in the last 168 hours.  Microbiology: No results found for this or any previous visit.  Coagulation Studies:  Recent Labs  11/06/15 0028  LABPROT 15.1  INR 1.17    Urinalysis:  Recent Labs Lab 11/06/15 0035  COLORURINE ORANGE*  LABSPEC 1.024  PHURINE 6.0  GLUCOSEU NEGATIVE  HGBUR NEGATIVE  BILIRUBINUR SMALL*  KETONESUR 15*  PROTEINUR NEGATIVE  NITRITE POSITIVE*  LEUKOCYTESUR SMALL*    Lipid Panel:  No results found for: CHOL, TRIG, HDL, CHOLHDL, VLDL, LDLCALC  HgbA1C: No results found for: HGBA1C  Urine Drug Screen:     Component Value Date/Time   LABOPIA NONE DETECTED 11/06/2015 0035   COCAINSCRNUR NONE DETECTED 11/06/2015 0035   LABBENZ NONE DETECTED 11/06/2015 0035   AMPHETMU NONE DETECTED 11/06/2015 0035   THCU NONE DETECTED 11/06/2015 0035   LABBARB NONE DETECTED 11/06/2015 0035    Alcohol Level:  Recent Labs Lab 11/06/15 0028  ETH <5    Other results:  Imaging: Dg Chest 2 View  11/06/2015  CLINICAL DATA:  Acute onset of confusion. Recent fall, with concern for chest injury. Cough. Initial encounter. EXAM: CHEST  2 VIEW COMPARISON:  Chest radiograph performed 10/19/2015 FINDINGS: The lungs are well-aerated and clear. Density overlying the left lung base is thought to reflect a lead. There is no evidence of focal opacification, pleural effusion or pneumothorax. The heart is normal in size; the mediastinal contour is within normal limits. No acute osseous abnormalities are seen. IMPRESSION: No acute cardiopulmonary process seen. No displaced rib fractures identified. Electronically Signed   By: Garald Balding M.D.   On: 11/06/2015 01:26   Dg Elbow Complete Left  11/06/2015  CLINICAL DATA:  Acute onset of confusion. Recent fall, with laceration at the posterior left elbow. Initial encounter. EXAM: LEFT  ELBOW - COMPLETE 3+ VIEW COMPARISON:  Left elbow radiographs performed 08/18/2004 FINDINGS: There is no evidence of fracture or dislocation. The visualized plate and screws along the olecranon is grossly intact, without evidence of loosening. The visualized joint spaces are preserved. No significant joint effusion is identified. The soft tissues are unremarkable in appearance. IMPRESSION: No evidence of fracture or dislocation. Hardware appears grossly intact. Electronically Signed   By: Garald Balding M.D.   On: 11/06/2015 01:23   Ct Head Wo Contrast  11/06/2015  CLINICAL DATA:  Acute onset of left upper and lower extremity weakness and numbness. Status post fall. Left facial numbness. Concern for cervical spine injury. Initial encounter. EXAM: CT HEAD WITHOUT CONTRAST CT CERVICAL SPINE WITHOUT CONTRAST TECHNIQUE: Multidetector CT imaging of the head and cervical spine was performed following the standard protocol without intravenous contrast. Multiplanar CT image reconstructions of the cervical spine were also generated. COMPARISON:  CT of the head and cervical spine performed 10/19/2015 FINDINGS: CT HEAD FINDINGS There is no evidence of acute infarction, mass lesion, or intra- or extra-axial hemorrhage on CT. Prominence of the ventricles and sulci suggests mild  cortical volume loss. Mild cerebellar atrophy is noted. Scattered periventricular and subcortical white matter change likely reflects small vessel ischemic microangiopathy. Chronic lacunar infarcts are seen at the basal ganglia bilaterally. The brainstem and fourth ventricle are within normal limits. The cerebral hemispheres demonstrate grossly normal gray-white differentiation. No mass effect or midline shift is seen. There is no evidence of fracture; visualized osseous structures are unremarkable in appearance. The orbits are within normal limits. Mucosal thickening is noted at the maxillary sinuses bilaterally. The remaining paranasal sinuses and  mastoid air cells are well-aerated. No significant soft tissue abnormalities are seen. CT CERVICAL SPINE FINDINGS There is no evidence of fracture or subluxation. Vertebral bodies demonstrate normal height and alignment. Minimal multilevel disc space narrowing is noted along the cervical spine, with scattered endplate subcortical cystic change. Scattered small anterior and posterior disc osteophyte complexes are noted along the cervical spine. Prevertebral soft tissues are within normal limits. The thyroid gland is unremarkable in appearance. Emphysematous change is noted at the lung apices, with minimal associated scarring. No significant soft tissue abnormalities are seen. IMPRESSION: 1. No evidence of traumatic intracranial injury or fracture. 2. No evidence of fracture or subluxation along the cervical spine. 3. Mild cortical volume loss and scattered small vessel ischemic microangiopathy. Chronic lacunar infarcts at the basal ganglia bilaterally. 4. Minimal degenerative change along the cervical spine. 5. Emphysematous change at the lung apices, with minimal associated scarring. Electronically Signed   By: Garald Balding M.D.   On: 11/06/2015 01:34   Ct Cervical Spine Wo Contrast  11/06/2015  CLINICAL DATA:  Acute onset of left upper and lower extremity weakness and numbness. Status post fall. Left facial numbness. Concern for cervical spine injury. Initial encounter. EXAM: CT HEAD WITHOUT CONTRAST CT CERVICAL SPINE WITHOUT CONTRAST TECHNIQUE: Multidetector CT imaging of the head and cervical spine was performed following the standard protocol without intravenous contrast. Multiplanar CT image reconstructions of the cervical spine were also generated. COMPARISON:  CT of the head and cervical spine performed 10/19/2015 FINDINGS: CT HEAD FINDINGS There is no evidence of acute infarction, mass lesion, or intra- or extra-axial hemorrhage on CT. Prominence of the ventricles and sulci suggests mild cortical  volume loss. Mild cerebellar atrophy is noted. Scattered periventricular and subcortical white matter change likely reflects small vessel ischemic microangiopathy. Chronic lacunar infarcts are seen at the basal ganglia bilaterally. The brainstem and fourth ventricle are within normal limits. The cerebral hemispheres demonstrate grossly normal gray-white differentiation. No mass effect or midline shift is seen. There is no evidence of fracture; visualized osseous structures are unremarkable in appearance. The orbits are within normal limits. Mucosal thickening is noted at the maxillary sinuses bilaterally. The remaining paranasal sinuses and mastoid air cells are well-aerated. No significant soft tissue abnormalities are seen. CT CERVICAL SPINE FINDINGS There is no evidence of fracture or subluxation. Vertebral bodies demonstrate normal height and alignment. Minimal multilevel disc space narrowing is noted along the cervical spine, with scattered endplate subcortical cystic change. Scattered small anterior and posterior disc osteophyte complexes are noted along the cervical spine. Prevertebral soft tissues are within normal limits. The thyroid gland is unremarkable in appearance. Emphysematous change is noted at the lung apices, with minimal associated scarring. No significant soft tissue abnormalities are seen. IMPRESSION: 1. No evidence of traumatic intracranial injury or fracture. 2. No evidence of fracture or subluxation along the cervical spine. 3. Mild cortical volume loss and scattered small vessel ischemic microangiopathy. Chronic lacunar infarcts at the basal ganglia bilaterally. 4.  Minimal degenerative change along the cervical spine. 5. Emphysematous change at the lung apices, with minimal associated scarring. Electronically Signed   By: Garald Balding M.D.   On: 11/06/2015 01:34   Dg Knee Complete 4 Views Left  11/06/2015  CLINICAL DATA:  Acute onset of confusion. Status post fall, with anterior left  knee laceration. Initial encounter. EXAM: LEFT KNEE - COMPLETE 4+ VIEW COMPARISON:  None. FINDINGS: There is no evidence of fracture or dislocation. The joint spaces are preserved. No significant degenerative change is seen; the patellofemoral joint is grossly unremarkable in appearance. A small knee joint effusion is suggested. The visualized soft tissues are otherwise unremarkable in appearance. IMPRESSION: 1. No evidence of fracture or dislocation. 2. Small knee joint effusion suggested. Electronically Signed   By: Garald Balding M.D.   On: 11/06/2015 01:25    Assessment: 69 y.o. male with no noted past medical history (does not follow up with doctor on regular basis) presenting with sudden onset of left-sided weakness and sensory deficits. Out of tPA window. Cannot rule out CVA, likely small vessel disease. Will be admitted for further workup.    Plan: 1. HgbA1c, fasting lipid panel 2. MRI, MRA  of the brain without contrast 3. PT consult, OT consult, Speech consult 4. Echocardiogram 5. Carotid dopplers 6. Prophylactic therapy-ASA '325mg'$  daily 7. Risk factor modification 8. Telemetry monitoring 9. Frequent neuro checks 10. NPO until RN stroke swallow screen     Jim Like, DO [facility] [phone]  If 7pm- 7am, please page neurology on call as listed in [place]. 11/06/2015, 2:15 AM

## 2015-11-06 NOTE — Progress Notes (Addendum)
Patient Demographics  Tyler Pierce, is a 69 y.o. male, DOB - 14-May-1946, [id number]  Admit date - 11/05/2015   Admitting Physician Theressa Millard, MD  Outpatient Primary MD for the patient is No PCP Per Patient  LOS - 0   Chief Complaint  Patient presents with  . Chest Pain       Admission HPI/Brief narrative: 69 y.o. male with no r past medical history presents  with left sided weakness, MRI brain significant for acute nonhemorrhagic infarct in right basal ganglia. Subjective:   Tyler Pierce today has, No headache, No chest pain, No abdominal pain - reports left-sided weakness feels the same, ports occasional cough as well . Assessment & Plan    Principal Problem:   CVA (cerebral infarction) Active Problems:   Left-sided weakness   Fall   Hypokalemia   Thrombocytopenia (HCC)   Alcohol abuse  Acute right basal ganglia CVA  - With residual left-sided weakness, dysphagia  - MRI Acute nonhemorrhagic infarct of the right basal ganglia measures up to 2.7 cm. with multiple remote infarcts. - MRA - Diffuse distal medium and small vessel disease. - Carotid Doppler:1-39% internal carotid artery stenosis bilaterally. The left vertebral artery is patent with antegrade flow. Unable to visualize the right vertebral artery. - 2-D echo with no evidence of embolic source identified  - Currently on aspirin 300 mg suppository   Hypokalemia - Will replete  with IV fluids  Tobacco abuse - Patient was counseled  - Patient denies alcohol abuse  Code Status: Full  Family Communication: none at bedside  Disposition Plan: pending further work up.   Procedures none   Consults  neurology   Medications  Scheduled Meds: . aspirin  300 mg Rectal Daily  . enoxaparin (LOVENOX) injection  40 mg Subcutaneous Daily   Continuous Infusions: . 0.9 % NaCl with KCl 20 mEq / L     PRN  Meds:.senna-docusate  DVT Prophylaxis  Lovenox -  Lab Results  Component Value Date   PLT 122* 11/06/2015    Antibiotics   Anti-infectives    None          Objective:   Filed Vitals:   11/06/15 0600 11/06/15 0800 11/06/15 1000 11/06/15 1200  BP: 129/66 115/58 117/72 115/68  Pulse: 58 70 61 64  Temp: 98.6 F (37 C) 98.8 F (37.1 C) 98.4 F (36.9 C) 98.4 F (36.9 C)  TempSrc: Oral Oral Oral Oral  Resp: '16 18 14 15  '$ Height:      Weight:      SpO2: 95% 93% 94% 95%    Wt Readings from Last 3 Encounters:  11/06/15 69.718 kg (153 lb 11.2 oz)     Intake/Output Summary (Last 24 hours) at 11/06/15 1236 Last data filed at 11/06/15 0100  Gross per 24 hour  Intake      0 ml  Output    200 ml  Net   -200 ml     Physical Exam  Awake Alert,with aphasia Supple Neck,No JVD,  Symmetrical Chest wall movement, Good air movement bilaterally, no use of accessory muscles  RRR,No Gallops,Rubs or new Murmurs, No Parasternal Heave +ve B.Sounds, Abd Soft, No tenderness, No organomegaly appriciated, No rebound - guarding or rigidity. No  Cyanosis, Clubbing or edema, significant left side weakness   Data Review   Micro Results No results found for this or any previous visit (from the past 240 hour(s)).  Radiology Reports Dg Chest 2 View  11/06/2015  CLINICAL DATA:  Acute onset of confusion. Recent fall, with concern for chest injury. Cough. Initial encounter. EXAM: CHEST  2 VIEW COMPARISON:  Chest radiograph performed 10/19/2015 FINDINGS: The lungs are well-aerated and clear. Density overlying the left lung base is thought to reflect a lead. There is no evidence of focal opacification, pleural effusion or pneumothorax. The heart is normal in size; the mediastinal contour is within normal limits. No acute osseous abnormalities are seen. IMPRESSION: No acute cardiopulmonary process seen. No displaced rib fractures identified. Electronically Signed   By: Garald Balding M.D.   On:  11/06/2015 01:26   Dg Chest 2 View  10/19/2015  CLINICAL DATA:  Cough and chest congestion. Shortness breath. Chest pain. Weakness. COPD. EXAM: CHEST  2 VIEW COMPARISON:  10/17/2006 FINDINGS: The heart size and mediastinal contours are within normal limits. Both lungs are clear. Pulmonary hyperinflation again seen, consistent with COPD. No evidence of pneumothorax or pleural effusion. The visualized skeletal structures are unremarkable. IMPRESSION: COPD.  No active cardiopulmonary disease. Electronically Signed   By: Earle Gell M.D.   On: 10/19/2015 17:11   Dg Elbow Complete Left  11/06/2015  CLINICAL DATA:  Acute onset of confusion. Recent fall, with laceration at the posterior left elbow. Initial encounter. EXAM: LEFT ELBOW - COMPLETE 3+ VIEW COMPARISON:  Left elbow radiographs performed 08/18/2004 FINDINGS: There is no evidence of fracture or dislocation. The visualized plate and screws along the olecranon is grossly intact, without evidence of loosening. The visualized joint spaces are preserved. No significant joint effusion is identified. The soft tissues are unremarkable in appearance. IMPRESSION: No evidence of fracture or dislocation. Hardware appears grossly intact. Electronically Signed   By: Garald Balding M.D.   On: 11/06/2015 01:23   Ct Head Wo Contrast  11/06/2015  CLINICAL DATA:  Acute onset of left upper and lower extremity weakness and numbness. Status post fall. Left facial numbness. Concern for cervical spine injury. Initial encounter. EXAM: CT HEAD WITHOUT CONTRAST CT CERVICAL SPINE WITHOUT CONTRAST TECHNIQUE: Multidetector CT imaging of the head and cervical spine was performed following the standard protocol without intravenous contrast. Multiplanar CT image reconstructions of the cervical spine were also generated. COMPARISON:  CT of the head and cervical spine performed 10/19/2015 FINDINGS: CT HEAD FINDINGS There is no evidence of acute infarction, mass lesion, or intra- or  extra-axial hemorrhage on CT. Prominence of the ventricles and sulci suggests mild cortical volume loss. Mild cerebellar atrophy is noted. Scattered periventricular and subcortical white matter change likely reflects small vessel ischemic microangiopathy. Chronic lacunar infarcts are seen at the basal ganglia bilaterally. The brainstem and fourth ventricle are within normal limits. The cerebral hemispheres demonstrate grossly normal gray-white differentiation. No mass effect or midline shift is seen. There is no evidence of fracture; visualized osseous structures are unremarkable in appearance. The orbits are within normal limits. Mucosal thickening is noted at the maxillary sinuses bilaterally. The remaining paranasal sinuses and mastoid air cells are well-aerated. No significant soft tissue abnormalities are seen. CT CERVICAL SPINE FINDINGS There is no evidence of fracture or subluxation. Vertebral bodies demonstrate normal height and alignment. Minimal multilevel disc space narrowing is noted along the cervical spine, with scattered endplate subcortical cystic change. Scattered small anterior and posterior disc osteophyte complexes are  noted along the cervical spine. Prevertebral soft tissues are within normal limits. The thyroid gland is unremarkable in appearance. Emphysematous change is noted at the lung apices, with minimal associated scarring. No significant soft tissue abnormalities are seen. IMPRESSION: 1. No evidence of traumatic intracranial injury or fracture. 2. No evidence of fracture or subluxation along the cervical spine. 3. Mild cortical volume loss and scattered small vessel ischemic microangiopathy. Chronic lacunar infarcts at the basal ganglia bilaterally. 4. Minimal degenerative change along the cervical spine. 5. Emphysematous change at the lung apices, with minimal associated scarring. Electronically Signed   By: Garald Balding M.D.   On: 11/06/2015 01:34   Ct Head Wo Contrast  10/19/2015   CLINICAL DATA:  Dizziness with fall.  Pain after fall EXAM: CT HEAD WITHOUT CONTRAST CT CERVICAL SPINE WITHOUT CONTRAST TECHNIQUE: Multidetector CT imaging of the head and cervical spine was performed following the standard protocol without intravenous contrast. Multiplanar CT image reconstructions of the cervical spine were also generated. COMPARISON:  None. FINDINGS: CT HEAD FINDINGS There is mild diffuse atrophy with slightly greater parietal lobe atrophy than elsewhere. There is no intracranial mass, hemorrhage, extra-axial fluid collection, or midline shift. There is small vessel disease throughout the centra semiovale bilaterally. There is evidence of a prior focal lacunar infarct in the inferior mid left centrum semiovale. There is evidence of an age uncertain small infarct in the anterior limb of the left external capsule. The bony calvarium appears intact. The mastoid air cells are clear. There is mucosal thickening in both maxillary antra as well as in multiple ethmoid air cells, the inferior frontal sinuses bilaterally, and the right sphenoid sinus. No intraorbital lesions are identified. CT CERVICAL SPINE FINDINGS There is no fracture or spondylolisthesis. Prevertebral soft tissues and predental space regions are normal. There is moderate disc space narrowing at C3-4, C4-5, C5-6, and C6-7. There are anterior osteophytes at C3, C4, C5, and C6. There is facet hypertrophy at multiple levels. Exit foraminal narrowing is noted at C3-4, C4-5, C5-6, and C6-7 bilaterally. No disc extrusion or high-grade stenosis. There are scattered foci of coronary artery calcification bilaterally. There is bullous disease in each lung apex. IMPRESSION: CT head: Atrophy with extensive periventricular small vessel disease. 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 No hemorrhage or mass effect. Multifocal paranasal sinus disease. CT cervical spine: No fracture or spondylolisthesis. Multilevel  osteoarthritic change. Scattered calcification in the carotid arteries bilaterally. Bullous disease in each lung apex. Electronically Signed   By: Lowella Grip III M.D.   On: 10/19/2015 19:52   Ct Cervical Spine Wo Contrast  11/06/2015  CLINICAL DATA:  Acute onset of left upper and lower extremity weakness and numbness. Status post fall. Left facial numbness. Concern for cervical spine injury. Initial encounter. EXAM: CT HEAD WITHOUT CONTRAST CT CERVICAL SPINE WITHOUT CONTRAST TECHNIQUE: Multidetector CT imaging of the head and cervical spine was performed following the standard protocol without intravenous contrast. Multiplanar CT image reconstructions of the cervical spine were also generated. COMPARISON:  CT of the head and cervical spine performed 10/19/2015 FINDINGS: CT HEAD FINDINGS There is no evidence of acute infarction, mass lesion, or intra- or extra-axial hemorrhage on CT. Prominence of the ventricles and sulci suggests mild cortical volume loss. Mild cerebellar atrophy is noted. Scattered periventricular and subcortical white matter change likely reflects small vessel ischemic microangiopathy. Chronic lacunar infarcts are seen at the basal ganglia bilaterally. The brainstem and fourth ventricle are within normal limits. The  cerebral hemispheres demonstrate grossly normal gray-white differentiation. No mass effect or midline shift is seen. There is no evidence of fracture; visualized osseous structures are unremarkable in appearance. The orbits are within normal limits. Mucosal thickening is noted at the maxillary sinuses bilaterally. The remaining paranasal sinuses and mastoid air cells are well-aerated. No significant soft tissue abnormalities are seen. CT CERVICAL SPINE FINDINGS There is no evidence of fracture or subluxation. Vertebral bodies demonstrate normal height and alignment. Minimal multilevel disc space narrowing is noted along the cervical spine, with scattered endplate subcortical  cystic change. Scattered small anterior and posterior disc osteophyte complexes are noted along the cervical spine. Prevertebral soft tissues are within normal limits. The thyroid gland is unremarkable in appearance. Emphysematous change is noted at the lung apices, with minimal associated scarring. No significant soft tissue abnormalities are seen. IMPRESSION: 1. No evidence of traumatic intracranial injury or fracture. 2. No evidence of fracture or subluxation along the cervical spine. 3. Mild cortical volume loss and scattered small vessel ischemic microangiopathy. Chronic lacunar infarcts at the basal ganglia bilaterally. 4. Minimal degenerative change along the cervical spine. 5. Emphysematous change at the lung apices, with minimal associated scarring. Electronically Signed   By: Garald Balding M.D.   On: 11/06/2015 01:34   Ct Cervical Spine Wo Contrast  10/19/2015  CLINICAL DATA:  Dizziness with fall.  Pain after fall EXAM: CT HEAD WITHOUT CONTRAST CT CERVICAL SPINE WITHOUT CONTRAST TECHNIQUE: Multidetector CT imaging of the head and cervical spine was performed following the standard protocol without intravenous contrast. Multiplanar CT image reconstructions of the cervical spine were also generated. COMPARISON:  None. FINDINGS: CT HEAD FINDINGS There is mild diffuse atrophy with slightly greater parietal lobe atrophy than elsewhere. There is no intracranial mass, hemorrhage, extra-axial fluid collection, or midline shift. There is small vessel disease throughout the centra semiovale bilaterally. There is evidence of a prior focal lacunar infarct in the inferior mid left centrum semiovale. There is evidence of an age uncertain small infarct in the anterior limb of the left external capsule. The bony calvarium appears intact. The mastoid air cells are clear. There is mucosal thickening in both maxillary antra as well as in multiple ethmoid air cells, the inferior frontal sinuses bilaterally, and the right  sphenoid sinus. No intraorbital lesions are identified. CT CERVICAL SPINE FINDINGS There is no fracture or spondylolisthesis. Prevertebral soft tissues and predental space regions are normal. There is moderate disc space narrowing at C3-4, C4-5, C5-6, and C6-7. There are anterior osteophytes at C3, C4, C5, and C6. There is facet hypertrophy at multiple levels. Exit foraminal narrowing is noted at C3-4, C4-5, C5-6, and C6-7 bilaterally. No disc extrusion or high-grade stenosis. There are scattered foci of coronary artery calcification bilaterally. There is bullous disease in each lung apex. IMPRESSION: CT head: Atrophy with extensive periventricular small vessel disease. 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 No hemorrhage or mass effect. Multifocal paranasal sinus disease. CT cervical spine: No fracture or spondylolisthesis. Multilevel osteoarthritic change. Scattered calcification in the carotid arteries bilaterally. Bullous disease in each lung apex. Electronically Signed   By: Lowella Grip III M.D.   On: 10/19/2015 19:52   Mr Brain Wo Contrast  11/06/2015  CLINICAL DATA:  Sudden onset of left-sided weakness involving the face, upper and lower extremity beginning over 24 hours ago. The patient fell due to weakness. EXAM: MRI HEAD WITHOUT CONTRAST MRA HEAD WITHOUT CONTRAST TECHNIQUE: Multiplanar, multiecho pulse sequences of the  brain and surrounding structures were obtained without intravenous contrast. Angiographic images of the head were obtained using MRA technique without contrast. COMPARISON:  CT head without contrast 10/19/2015. FINDINGS: MRI HEAD FINDINGS An acute nonhemorrhagic infarct within the right basal ganglia measures 2.7 cm maximally. There is associated T2 signal change in edema with this infarct. A remote infarct in the left basal ganglia measures 10 mm. There is no acute hemorrhage or mass lesion. Moderate to severe diffuse periventricular and  subcortical white matter changes are evident bilaterally. Additional smaller remote lacunar infarcts are present within the basal ganglia bilaterally. The ventricles are proportionate to the degree of atrophy. No significant extra-axial fluid collection is present. The brainstem and cerebellum are unremarkable. The right vertebral artery is hypoplastic. Flow is present in the major intracranial arteries. The globes and orbits are intact. There is near complete opacification of the left maxillary sinus. Prominent mucosal thickening is noted along the floor of the right maxillary sinus with a small fluid level. The paranasal sinuses and mastoid air cells are otherwise clear. The skullbase is within normal limits. Midline sagittal images are unremarkable MRA HEAD FINDINGS The internal carotid arteries are within normal limits from the high cervical segments through the ICA termini bilaterally. The A1 and M1 segments are intact. The anterior communicating artery is patent. The MCA bifurcations are intact. Moderate medium and small vessel attenuation is symmetric. The left vertebral artery is the dominant vessel. The right vertebral artery is hypoplastic above the dural margin. The basilar artery is intact. Both posterior cerebral arteries originate from the basilar tip. There is moderate attenuation of distal PCA branch vessels bilaterally. IMPRESSION: 1. 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 2. Remote 10 mm nonhemorrhagic infarct of the left basal ganglia in a similar location. 3. Additional smaller remote lacunar infarcts are present in the basal ganglia bilaterally. 4. Advanced diffuse confluent periventricular and subcortical white matter disease bilaterally compatible with severe microvascular ischemia. 5. The MRA demonstrates diffuse distal medium and small vessel disease without significant proximal stenosis, aneurysm, or branch vessel occlusion. These results will be  called to the ordering clinician or representative by the Radiologist Assistant, and communication documented in the PACS or zVision Dashboard. Electronically Signed   By: San Morelle M.D.   On: 11/06/2015 08:30   Dg Knee Complete 4 Views Left  11/06/2015  CLINICAL DATA:  Acute onset of confusion. Status post fall, with anterior left knee laceration. Initial encounter. EXAM: LEFT KNEE - COMPLETE 4+ VIEW COMPARISON:  None. FINDINGS: There is no evidence of fracture or dislocation. The joint spaces are preserved. No significant degenerative change is seen; the patellofemoral joint is grossly unremarkable in appearance. A small knee joint effusion is suggested. The visualized soft tissues are otherwise unremarkable in appearance. IMPRESSION: 1. No evidence of fracture or dislocation. 2. Small knee joint effusion suggested. Electronically Signed   By: Garald Balding M.D.   On: 11/06/2015 01:25   Mr Jodene Nam Head/brain Wo Cm  11/06/2015  CLINICAL DATA:  Sudden onset of left-sided weakness involving the face, upper and lower extremity beginning over 24 hours ago. The patient fell due to weakness. EXAM: MRI HEAD WITHOUT CONTRAST MRA HEAD WITHOUT CONTRAST TECHNIQUE: Multiplanar, multiecho pulse sequences of the brain and surrounding structures were obtained without intravenous contrast. Angiographic images of the head were obtained using MRA technique without contrast. COMPARISON:  CT head without contrast 10/19/2015. FINDINGS: MRI HEAD FINDINGS An acute nonhemorrhagic infarct within the right  basal ganglia measures 2.7 cm maximally. There is associated T2 signal change in edema with this infarct. A remote infarct in the left basal ganglia measures 10 mm. There is no acute hemorrhage or mass lesion. Moderate to severe diffuse periventricular and subcortical white matter changes are evident bilaterally. Additional smaller remote lacunar infarcts are present within the basal ganglia bilaterally. The ventricles are  proportionate to the degree of atrophy. No significant extra-axial fluid collection is present. The brainstem and cerebellum are unremarkable. The right vertebral artery is hypoplastic. Flow is present in the major intracranial arteries. The globes and orbits are intact. There is near complete opacification of the left maxillary sinus. Prominent mucosal thickening is noted along the floor of the right maxillary sinus with a small fluid level. The paranasal sinuses and mastoid air cells are otherwise clear. The skullbase is within normal limits. Midline sagittal images are unremarkable MRA HEAD FINDINGS The internal carotid arteries are within normal limits from the high cervical segments through the ICA termini bilaterally. The A1 and M1 segments are intact. The anterior communicating artery is patent. The MCA bifurcations are intact. Moderate medium and small vessel attenuation is symmetric. The left vertebral artery is the dominant vessel. The right vertebral artery is hypoplastic above the dural margin. The basilar artery is intact. Both posterior cerebral arteries originate from the basilar tip. There is moderate attenuation of distal PCA branch vessels bilaterally. IMPRESSION: 1. 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 2. Remote 10 mm nonhemorrhagic infarct of the left basal ganglia in a similar location. 3. Additional smaller remote lacunar infarcts are present in the basal ganglia bilaterally. 4. Advanced diffuse confluent periventricular and subcortical white matter disease bilaterally compatible with severe microvascular ischemia. 5. The MRA demonstrates diffuse distal medium and small vessel disease without significant proximal stenosis, aneurysm, or branch vessel occlusion. These results will be called to the ordering clinician or representative by the Radiologist Assistant, and communication documented in the PACS or zVision Dashboard. Electronically Signed   By:  San Morelle M.D.   On: 11/06/2015 08:30     CBC  Recent Labs Lab 11/06/15 0028 11/06/15 0036  WBC 6.4  --   HGB 12.9* 13.9  HCT 36.6* 41.0  PLT 122*  --   MCV 108.9*  --   MCH 38.4*  --   MCHC 35.2  --   RDW 12.7  --   LYMPHSABS 1.9  --   MONOABS 0.5  --   EOSABS 0.1  --   BASOSABS 0.0  --     Chemistries   Recent Labs Lab 11/06/15 0028 11/06/15 0036  NA 135 136  K 3.4* 3.2*  CL 100* 97*  CO2 25  --   GLUCOSE 96 92  BUN 10 11  CREATININE 0.99 0.90  CALCIUM 8.5*  --   AST 29  --   ALT 25  --   ALKPHOS 54  --   BILITOT 2.1*  --    ------------------------------------------------------------------------------------------------------------------ estimated creatinine clearance is 77.4 mL/min (by C-G formula based on Cr of 0.9). ------------------------------------------------------------------------------------------------------------------ No results for input(s): HGBA1C in the last 72 hours. ------------------------------------------------------------------------------------------------------------------  Recent Labs  11/06/15 0433  CHOL 124  HDL 37*  LDLCALC 73  TRIG 69  CHOLHDL 3.4   ------------------------------------------------------------------------------------------------------------------ No results for input(s): TSH, T4TOTAL, T3FREE, THYROIDAB in the last 72 hours.  Invalid input(s): FREET3 ------------------------------------------------------------------------------------------------------------------ No results for input(s): VITAMINB12, FOLATE, FERRITIN, TIBC, IRON, RETICCTPCT in the last 72 hours.  Coagulation profile  Recent Labs Lab 11/06/15 0028  INR 1.17    No results for input(s): DDIMER in the last 72 hours.  Cardiac Enzymes No results for input(s): CKMB, TROPONINI, MYOGLOBIN in the last 168 hours.  Invalid input(s):  CK ------------------------------------------------------------------------------------------------------------------ Invalid input(s): POCBNP     Time Spent in minutes   No Sherlyn Lees, Tyshun Tuckerman M.D on 11/06/2015 at 12:36 PM  Between 7am to 7pm - Pager - [phone]  After 7pm go to [email or web address] - password [facility]  [facility]   Office  [phone]

## 2015-11-06 NOTE — ED Notes (Signed)
Patient transported to CT 

## 2015-11-06 NOTE — Progress Notes (Signed)
Patient arrived via ED tech. VSS and reporting no pain. Oriented to room and equipment. Will continue to monitor. Joaquin Bend E, [place] 11/06/2015 [phone]

## 2015-11-06 NOTE — Progress Notes (Addendum)
STROKE TEAM PROGRESS NOTE   HISTORY Tyler Pierce is a 69 y.o. male with no reported past medical history presenting with sudden onset of left sided weakness. LSW 0500 on 12/24. Involved face, arm and leg, notes having a fall due to the weakness. Reports being on the floor for 4 hours after the fall due to weakness. Notes no prior CVA or TIA. Not on a daily antiplatelet. Patient is homeless.   Date last known well: 12/24 Time last known well: 0500 tPA Given: No: outside tPA window Modified Rankin: Rankin Score=0   SUBJECTIVE (INTERVAL HISTORY) His family is not at bedside.  Overall he feels his condition is unchanged.    OBJECTIVE Temp:  [97.9 F (36.6 C)-98.8 F (37.1 C)] 98.4 F (36.9 C) (12/25 1000) Pulse Rate:  [58-76] 61 (12/25 1000) Cardiac Rhythm:  [-]  Resp:  [13-20] 14 (12/25 1000) BP: (112-143)/(58-103) 117/72 mmHg (12/25 1000) SpO2:  [93 %-100 %] 94 % (12/25 1000) Weight:  [69.718 kg (153 lb 11.2 oz)] 69.718 kg (153 lb 11.2 oz) (12/25 0349)  CBC:  Recent Labs Lab 11/06/15 0028 11/06/15 0036  WBC 6.4  --   NEUTROABS 3.8  --   HGB 12.9* 13.9  HCT 36.6* 41.0  MCV 108.9*  --   PLT 122*  --     Basic Metabolic Panel:  Recent Labs Lab 11/06/15 0028 11/06/15 0036  NA 135 136  K 3.4* 3.2*  CL 100* 97*  CO2 25  --   GLUCOSE 96 92  BUN 10 11  CREATININE 0.99 0.90  CALCIUM 8.5*  --     Lipid Panel:    Component Value Date/Time   CHOL 124 11/06/2015 0433   TRIG 69 11/06/2015 0433   HDL 37* 11/06/2015 0433   CHOLHDL 3.4 11/06/2015 0433   VLDL 14 11/06/2015 0433   LDLCALC 73 11/06/2015 0433   HgbA1c: No results found for: HGBA1C Urine Drug Screen:    Component Value Date/Time   LABOPIA NONE DETECTED 11/06/2015 0035   COCAINSCRNUR NONE DETECTED 11/06/2015 0035   LABBENZ NONE DETECTED 11/06/2015 0035   AMPHETMU NONE DETECTED 11/06/2015 0035   THCU NONE DETECTED 11/06/2015 0035   LABBARB NONE DETECTED 11/06/2015 0035      IMAGING  Dg Chest 2  View 11/06/2015   No acute cardiopulmonary process seen. No displaced rib fractures identified.     Dg Elbow Complete Left 11/06/2015   No evidence of fracture or dislocation. Hardware appears grossly intact.   Ct Head Wo Contrast 11/06/2015   1. No evidence of traumatic intracranial injury or fracture.  2. No evidence of fracture or subluxation along the cervical spine.  3. Mild cortical volume loss and scattered small vessel ischemic microangiopathy. Chronic lacunar infarcts at the basal ganglia bilaterally.  4. Minimal degenerative change along the cervical spine.  5. Emphysematous change at the lung apices, with minimal associated scarring.   Ct Cervical Spine Wo Contrast 11/06/2015   1. No evidence of traumatic intracranial injury or fracture.  2. No evidence of fracture or subluxation along the cervical spine.  3. Mild cortical volume loss and scattered small vessel ischemic microangiopathy. Chronic lacunar infarcts at the basal ganglia bilaterally.  4. Minimal degenerative change along the cervical spine.  5. Emphysematous change at the lung apices, with minimal associated scarring.   Mr Tyler Pierce Head/brain Wo Cm 11/06/2015   1. 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  2. Remote 10 mm nonhemorrhagic  infarct of the left basal ganglia in a similar location.  3. Additional smaller remote lacunar infarcts are present in the basal ganglia bilaterally.  4. Advanced diffuse confluent periventricular and subcortical white matter disease bilaterally compatible with severe microvascular ischemia.  5. The MRA demonstrates diffuse distal medium and small vessel disease without significant proximal stenosis, aneurysm, or branch vessel occlusion.   Dg Knee Complete 4 Views Left 11/06/2015   1. No evidence of fracture or dislocation.  2. Small knee joint effusion suggested.    PHYSICAL EXAM General - Well nourished, well developed, in no apparent  distress. HEENT:  NCAT, PERRL;  Cardiovascular - Regular rate and rhythm with no murmur. Abd:  Benign Extrem:  No CCE  Mental Status - mildly slurred speech Level of arousal and orientation to time, place, and person were intact. Language including expression, naming, repetition, comprehension was assessed and found intact. Fund of Knowledge was assessed and was intact.  Cranial Nerves II - XII - II - Visual field intact OU.  PERRL III, IV, VI - Extraocular movements intact. V - Facial sensation intact bilaterally. VII - left nasolabial fold flat VIII - Hearing & vestibular intact bilaterally. X - Palate elevates symmetrically,  XI - Chin turning & shoulder shrug intact bilaterally. XII - Tongue protrusion intact.  Motor Strength - The patient's strength was LUE 3/5, LLE 4/5 proximal but 5/5 distal and pronator drift was present on the left, right UE and LE 5/5.   Sensory - Light touch, temperature/pinprick were assessed and were symmetrical.   Coordination - The patient had normal movements on finger to nose.  Gait and Station - not tested   ASSESSMENT/PLAN [name] is a 69 y.o. male with history of previous strokes, tobacco and alcohol use, presenting with acute onset of left hemiparesis and subsequent fall. He did not receive IV t-PA due to a presentation.  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  Resultant  left hemiparesis.  MRI Acute nonhemorrhagic infarct of the right basal ganglia measures up to 2.7 cm. with multiple remote infarcts.  MRA - Diffuse distal medium and small vessel disease.  Carotid Doppler - preliminary report - 1-39% internal carotid artery stenosis bilaterally. The left vertebral artery is patent with antegrade flow. Unable to visualize the right vertebral artery.  2D Echo pending  LDL - 73  HgbA1c pending  VTE prophylaxis - Lovenox  Diet Heart Room service appropriate?: Yes; Fluid consistency::  Thin  No antithrombotic prior to admission, now on No antithrombotic  - Start aspirin 325 mg daily  Patient counseled to be compliant with his antithrombotic medications  Ongoing aggressive stroke risk factor management  Therapy recommendations: Pending  Disposition:  Pending  Hypertension  Stable Permissive hypertension (OK if < 220/120) but gradually normalize in 5-7 days  Hyperlipidemia  Home meds: No lipid lowering medications prior to admission  LDL 73, goal < 70  Other Stroke Risk Factors  Advanced age  Cigarette smoker, advised to stop smoking  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  Hx stroke/TIA   Other Active Problems  Hypokalemia - supplemented  Hospital day # 0  To contact Stroke Continuity provider, please refer to [email or web address] After hours, contact General Neurology

## 2015-11-06 NOTE — ED Notes (Signed)
Pt. Called EMS for chest pain. En route with EMS chest pain went away. Pt is also c/o cough for the past 2 weeks

## 2015-11-06 NOTE — ED Provider Notes (Signed)
By signing my name below, I, Altamease Oiler, attest that this documentation has been prepared under the direction and in the presence of [place], DO. Electronically Signed: Altamease Oiler, ED Scribe. 11/06/2015. 2:45 AM.  TIME SEEN: 11:59 PM  CHIEF COMPLAINT: left-sided weakness and numbness  HPI: Brought in by EMS, Tyler Pierce is a 69 y.o. male who presents to the Emergency Department complaining of new weakness and numbness at the left upper and lower extremities with onset at approximately 5 AM this morning (11/05/15). The sensation was present upon waking and he has never had it before.  Pt states that he fell due to his symptoms.  Associated symptoms include left knee and elbow pain with abrasions. Pt denies numbness at the left side of the face. He also complains of chest pain with onset this afternoon and a cough for 2 weeks.  He states that he was seen last week for a viral illness. He has no history of stroke and notes that his left hand has been held in a somewhat fixed position since having a finger amputated years ago. Pt has no PCP or known medical history. He lives alone in a "shed". Denies alcohol and drug use. States he used to drink alcohol heavily but quit 2 weeks ago. She was having chest pain earlier today. No other pain. He describes as a pressure. No shortness of breath. No diaphoresis.  ROS: See HPI Constitutional: no fever  Eyes: no drainage  ENT: no runny nose   Cardiovascular: chest pain  Resp: no SOB  GI: no vomiting GU: no dysuria Integumentary: no rash  Allergy: no hives  Musculoskeletal: no leg swelling  Neurological: no slurred speech ROS otherwise negative  PAST MEDICAL HISTORY/PAST SURGICAL HISTORY:  History reviewed. No pertinent past medical history.  MEDICATIONS:  Prior to Admission medications   Medication Sig Start Date End Date Taking? Authorizing Provider  albuterol (PROVENTIL HFA;VENTOLIN HFA) 108 (90 BASE) MCG/ACT inhaler Inhale 1-2  puffs into the lungs every 6 (six) hours as needed for wheezing or shortness of breath. 10/19/15   Samantha Tripp Dowless, PA-C  azithromycin (ZITHROMAX) 250 MG tablet Take 1 tablet (250 mg total) by mouth daily. Take first 2 tablets together, then 1 every day until finished. 10/19/15   Samantha Tripp Dowless, PA-C  predniSONE (DELTASONE) 10 MG tablet Take 2 tablets (20 mg total) by mouth daily. 10/19/15   Samantha Tripp Dowless, PA-C    ALLERGIES:  Allergies  Allergen Reactions  . Penicillins     Loss of consciousness  Has patient had a PCN reaction causing immediate rash, facial/tongue/throat swelling, SOB or lightheadedness with hypotension: Yes Has patient had a PCN reaction causing severe rash involving mucus membranes or skin necrosis: No Has patient had a PCN reaction that required hospitalization No Has patient had a PCN reaction occurring within the last 10 years: No If all of the above answers are "NO", then may proceed with Cephalosporin use.    SOCIAL HISTORY:  Social History  Substance Use Topics  . Smoking status: Current Every Day Smoker -- 1.00 packs/day    Types: Cigarettes  . Smokeless tobacco: Never Used  . Alcohol Use: Yes    FAMILY HISTORY: History reviewed. No pertinent family history. Mild facial droop on left side  EXAM: BP 143/80 mmHg  Pulse 68  Temp(Src) 98.2 F (36.8 C) (Oral)  Resp 13  SpO2 99% CONSTITUTIONAL: Chronically ill appearing. Poor historian. Alert and oriented and responds appropriately to questions. GCS 15  HEAD: Normocephalic; atraumatic EYES: Conjunctivae clear, PERRL, EOMI ENT: normal nose; no rhinorrhea; moist mucous membranes; pharynx without lesions noted; no dental injury; no hemotypanum; no septal hematoma NECK: Supple, no meningismus, no LAD; no midline spinal tenderness, step-off or deformity CARD: RRR; S1 and S2 appreciated; no murmurs, no clicks, no rubs, no gallops RESP: Normal chest excursion without splinting or tachypnea;  breath sounds equal bilaterally; diffuse rhonchorous breath sounds, no wheezes, no rales; chest wall stable, nontender to palpation ABD/GI: Normal bowel sounds; non-distended; soft, non-tender, no rebound, no guarding PELVIS:  stable, nontender to palpation BACK:  The back appears normal and is non-tender to palpation, there is no CVA tenderness; no midline spinal tenderness, step-off or deformity EXT: Abrasions, erythema, and mild tenderness over left elbow and left knee. Normal ROM in all joints; otherwise non-tender to palpation; no edema; normal capillary refill; no cyanosis    SKIN: Normal color for age and race; warm NEURO: Moves all extremities equally. Diminished strength in the LUE and LLE compared to the right. Decreased sensation in LLE but otherwise normal sensation diffusely. He has left-sided facial droop. Otherwise cranial nerves II-12 intact. No dysarthria or aphasia. No pronator drift. PSYCH: The patient's mood and manner are appropriate. Poor hygiene. Pt covered in dirt and disheveled. Does not appear intoxicated.    MEDICAL DECISION MAKING: Patient here with left-sided weakness that is new. He is outside of TPA window given it started at 5 AM today when he woke up. States that because of that he fell. Has abrasions and tenderness over the left elbow and left knee. We'll obtain CT of his head, cervical spine, x-rays of his left elbow and knee. No other sign of trauma on exam. He is hemodynamically stable. Will obtain stroke labs, urine. He will need admission. Will consult neurology.  ED PROGRESS: Patient's labs are unremarkable. Troponin negative. CT of the head and cervical spine show no acute abnormality. He does have chronic lacunar infarcts of the basal ganglia bilaterally. No fractures of the elbow or knee. We'll discuss with neurology.  1:52 AM-Consult complete with Dr. Janann Colonel (Neurology). Patient case explained and discussed. He will see the pt in consult.   2:43 AM-Consult  complete with Dr. Arnoldo Morale [facility]) who is in the department. Patient case explained and discussed. Agrees to admit patient, inpatient telemetry, for further evaluation and treatment.   Patient does have urinary tract infection that will need treatment while hospitalized as well.     EKG Interpretation  Date/Time:  Saturday November 05 2015 23:34:00 EST Ventricular Rate:  66 PR Interval:  119 QRS Duration: 101 QT Interval:  405 QTC Calculation: 424 R Axis:   62 Text Interpretation:  Sinus rhythm Atrial premature complex Borderline short PR interval Borderline repolarization abnormality Baseline wander in lead(s) V5 No significant change since last tracing Confirmed by Mallie Giambra,  DO, Sherrell Weir [phone]) on 11/05/2015 11:50:06 PM         I personally performed the services described in this documentation, which was scribed in my presence. The recorded information has been reviewed and is accurate.     [place], DO 11/06/15 [phone]

## 2015-11-06 NOTE — Evaluation (Signed)
Clinical/Bedside Swallow Evaluation Patient Details  Name: Tyler Pierce MRN: [id number] Date of Birth: May 02, 1946  Today's Date: 11/06/2015 Time: SLP Start Time (ACUTE ONLY): 1018 SLP Stop Time (ACUTE ONLY): 1029 SLP Time Calculation (min) (ACUTE ONLY): 11 min  Past Medical History: History reviewed. No pertinent past medical history. Past Surgical History:  Past Surgical History  Procedure Laterality Date  . Finger amputation     HPI:  Tyler Pierce is an 69 y.o. male with no reported past medical history presenting with sudden onset of left sided weakness. Out of tPA window. MRI: acute nonhemorrhagic infarct within the right basal ganglia   Assessment / Plan / Recommendation Clinical Impression  Swallow evaluation complete. Patient presents with signs of both an oropharyngeal and esophageal dysphagia with CN VII and XII dysfunction impacting oral phase, suspected delays in swallow initiation, evidence of decreased airway protection with all consistencies trialed, and c/o 5/10 mid sternal chest pain post po intake. Patient declined further po trials due to pain. RN made aware. Recommend NPO given risk of aspiration signs of with both neurogenic and esophageal dysphagia. Will f/u 12/26 at bedside to determine potential to initiate a po diet vs complete instrumental exam.     Aspiration Risk  Moderate aspiration risk    Diet Recommendation NPO   Medication Administration: Via alternative means    Other  Recommendations Oral Care Recommendations: Oral care QID   Follow up Recommendations   (TBD)    Frequency and Duration min 2x/week  2 weeks       Prognosis Prognosis for Safe Diet Advancement: Good      Swallow Study   General HPI: Tyler Pierce is an 69 y.o. male with no reported past medical history presenting with sudden onset of left sided weakness. Out of tPA window. MRI: acute nonhemorrhagic infarct within the right basal ganglia Type of Study: Bedside Swallow  Evaluation Previous Swallow Assessment: none Diet Prior to this Study: NPO Temperature Spikes Noted: No Respiratory Status: Room air History of Recent Intubation: No Behavior/Cognition: Alert;Cooperative;Pleasant mood Oral Cavity Assessment: Within Functional Limits Oral Care Completed by SLP: No Oral Cavity - Dentition: Poor condition;Missing dentition Vision: Functional for self-feeding Self-Feeding Abilities: Able to feed self Patient Positioning: Upright in bed Baseline Vocal Quality: Hoarse Volitional Cough: Congested;Strong Volitional Swallow: Able to elicit    Oral/Motor/Sensory Function Overall Oral Motor/Sensory Function: Moderate impairment Facial ROM: Reduced left;Suspected CN VII (facial) dysfunction Facial Symmetry: Abnormal symmetry left;Suspected CN VII (facial) dysfunction Facial Strength: Reduced left;Suspected CN VII (facial) dysfunction Facial Sensation: Within Functional Limits Lingual ROM: Reduced left;Suspected CN XII (hypoglossal) dysfunction Lingual Symmetry: Within Functional Limits Lingual Strength: Reduced;Suspected CN XII (hypoglossal) dysfunction Lingual Sensation: Within Functional Limits Velum: Within Functional Limits Mandible: Within Functional Limits   Ice Chips Ice chips: Not tested   Thin Liquid Thin Liquid: Impaired Presentation: Self Fed;Cup Pharyngeal  Phase Impairments: Suspected delayed Swallow;Cough - Immediate    Nectar Thick Nectar Thick Liquid: Not tested   Honey Thick Honey Thick Liquid: Not tested   Puree Puree: Impaired Presentation: Spoon Pharyngeal Phase Impairments: Cough - Delayed (c/o mid sternal chest pain, refused further pos)   Solid Solid: Not tested      Tyler Rainwater MA, CCC-SLP [phone]  Tyler Pierce Tyler Pierce 11/06/2015,10:36 AM

## 2015-11-06 NOTE — H&P (Signed)
[facility] Admission History and Physical       HAAKON TITSWORTH KGM:[id number] DOB: [birth year] DOA: 11/05/2015  Referring physician: EDP PCP: No PCP Per Patient  Specialists:   Chief Complaint: Left Sided Weakness  HPI: GARALD RHEW is a 69 y.o. male who was brought to the ED he had been found on the ground in a shed.   He reports that he had sudden onset of left sided weakness and fell onto the ground.   He could not get back up due to the weakness.   His last know well was a 0500 AM on 11/05/2015.   He denies having any preceding headache or dizziness. He denies having chest pain.   He has a history of heavy drinking, and reports that his last drink was 2 weeks ago because he had  GI virus.   He was evlauted in the ED and a CT scan of the heaad was negative for acute findings.   He was seen by Neurology and referred for admission.       Review of Systems:  Constitutional: No Weight Loss, No Weight Gain, Night Sweats, Fevers, Chills, Dizziness, Light Headedness, Fatigue, or Generalized Weakness HEENT: No Headaches, Difficulty Swallowing,Tooth/Dental Problems,Sore Throat,  No Sneezing, Rhinitis, Ear Ache, Nasal Congestion, or Post Nasal Drip,  Cardio-vascular:  No Chest pain, Orthopnea, PND, Edema in Lower Extremities, Anasarca, Dizziness, Palpitations  Resp: No Dyspnea, No DOE, No Productive Cough, No Non-Productive Cough, No Hemoptysis, No Wheezing.    GI: No Heartburn, Indigestion, Abdominal Pain, Nausea, Vomiting, Diarrhea, Constipation, Hematemesis, Hematochezia, Melena, Change in Bowel Habits,  Loss of Appetite  GU: No Dysuria, No Change in Color of Urine, No Urgency or Urinary Frequency, No Flank pain.  Musculoskeletal: No Joint Pain or Swelling, No Decreased Range of Motion, No Back Pain.  Neurologic: No Syncope, No Seizures, +Left Sided Weakness, +Facial Droop, Paresthesia, Vision Disturbance or Loss, No Diplopia, No Vertigo, No Difficulty Walking,  Skin: No Rash or  Lesions. Psych: No Change in Mood or Affect, No Depression or Anxiety, No Memory loss, No Confusion, or Hallucinations   History reviewed. No pertinent past medical history.   Past Surgical History  Procedure Laterality Date  . Finger amputation        Prior to Admission medications   Medication Sig Start Date End Date Taking? Authorizing Provider  albuterol (PROVENTIL HFA;VENTOLIN HFA) 108 (90 BASE) MCG/ACT inhaler Inhale 1-2 puffs into the lungs every 6 (six) hours as needed for wheezing or shortness of breath. 10/19/15   Samantha Tripp Dowless, PA-C  azithromycin (ZITHROMAX) 250 MG tablet Take 1 tablet (250 mg total) by mouth daily. Take first 2 tablets together, then 1 every day until finished. 10/19/15   Samantha Tripp Dowless, PA-C  predniSONE (DELTASONE) 10 MG tablet Take 2 tablets (20 mg total) by mouth daily. 10/19/15   Samantha Tripp Dowless, PA-C     Allergies  Allergen Reactions  . Penicillins     Loss of consciousness  Has patient had a PCN reaction causing immediate rash, facial/tongue/throat swelling, SOB or lightheadedness with hypotension: Yes Has patient had a PCN reaction causing severe rash involving mucus membranes or skin necrosis: No Has patient had a PCN reaction that required hospitalization No Has patient had a PCN reaction occurring within the last 10 years: No If all of the above answers are "NO", then may proceed with Cephalosporin use.    Social History:  reports that he has been smoking Cigarettes.  He has been  smoking about 1.00 pack per day. He has never used smokeless tobacco. He reports that he drinks alcohol. He reports that he does not use illicit drugs.    History reviewed. No pertinent family history.     Physical Exam:  GEN:  Pleasant Disheveled Thin  69 y.o. Caucasian male examined and in no acute distress; cooperative with exam Filed Vitals:   11/06/15 0215 11/06/15 0230 11/06/15 0245 11/06/15 0300  BP: 124/82 131/75 124/74 118/75    Pulse: 65 59 62 63  Temp:      TempSrc:      Resp: '13 16 13 18  '$ SpO2: 97% 96% 96% 97%   Blood pressure 118/75, pulse 63, temperature 98.2 F (36.8 C), temperature source Oral, resp. rate 18, SpO2 97 %. PSYCH: He is alert and oriented x4; does not appear anxious does not appear depressed; affect is normal HEENT: Normocephalic and Atraumatic, Mucous membranes pink; PERRLA; EOM intact; Fundi:  Benign;  No scleral icterus, Nares: Patent, Oropharynx: Clear, Poor Dentition,    Neck:  FROM, No Cervical Lymphadenopathy nor Thyromegaly or Carotid Bruit; No JVD; Breasts:: Not examined CHEST WALL: No tenderness CHEST: Normal respiration, clear to auscultation bilaterally HEART: Regular rate and rhythm; no murmurs rubs or gallops BACK: No kyphosis or scoliosis; No CVA tenderness ABDOMEN: Positive Bowel Sounds, Scaphoid, Soft Non-Tender, No Rebound or Guarding; No Masses, No Organomegaly Rectal Exam: Not done EXTREMITIES: No Cyanosis, Clubbing, or Edema; No Ulcerations. Genitalia: not examined PULSES: 2+ and symmetric SKIN: Normal hydration no rash or ulceration CNS:  Alert and Oriented x 4, Mental Status:  Alert, Oriented, Thought Content Appropriate. Speech Fluent without evidence of Aphasia. Able to follow 3 step commands without difficulty.  In No obvious pain.   Cranial Nerves:  II: Discs flat bilaterally; Visual fields Intact, or Decreased peripheral vision to the left or right. Pupils equal and reactive.    III,IV, VI: Extra-ocular motions intact bilaterally    V,VII: smile Asymmetric, facial light touch sensation normal bilaterally    VIII: hearing intact Bilaterally    IX,X: gag reflex present    XI: bilateral shoulder shrug    XII: midline tongue extension   Motor:  Right:  Upper extremity 5/5     Left:  Upper extremity 4/5     Right:  Lower extremity 5/5    Left:  Lower extremity 4/5     Tone and Bulk:  normal tone throughout; no atrophy noted   Sensory:  Pinprick and light touch  intact throughout, bilaterally   Deep Tendon Reflexes: 2+ and symmetric throughout   Plantars/ Babinski:  Right:  normal  Left: upgoing     Cerebellar:  Finger to nose with difficulty with Left hand.   Gait: deferred    Vascular: pulses palpable throughout    Labs on Admission:  Basic Metabolic Panel:  Recent Labs Lab 11/06/15 0028 11/06/15 0036  NA 135 136  K 3.4* 3.2*  CL 100* 97*  CO2 25  --   GLUCOSE 96 92  BUN 10 11  CREATININE 0.99 0.90  CALCIUM 8.5*  --    Liver Function Tests:  Recent Labs Lab 11/06/15 0028  AST 29  ALT 25  ALKPHOS 54  BILITOT 2.1*  PROT 5.7*  ALBUMIN 3.0*   No results for input(s): LIPASE, AMYLASE in the last 168 hours. No results for input(s): AMMONIA in the last 168 hours. CBC:  Recent Labs Lab 11/06/15 0028 11/06/15 0036  WBC 6.4  --   NEUTROABS  3.8  --   HGB 12.9* 13.9  HCT 36.6* 41.0  MCV 108.9*  --   PLT 122*  --    Cardiac Enzymes: No results for input(s): CKTOTAL, CKMB, CKMBINDEX, TROPONINI in the last 168 hours.  BNP (last 3 results) No results for input(s): BNP in the last 8760 hours.  ProBNP (last 3 results) No results for input(s): PROBNP in the last 8760 hours.  CBG: No results for input(s): GLUCAP in the last 168 hours.  Radiological Exams on Admission: Dg Chest 2 View  11/06/2015  CLINICAL DATA:  Acute onset of confusion. Recent fall, with concern for chest injury. Cough. Initial encounter. EXAM: CHEST  2 VIEW COMPARISON:  Chest radiograph performed 10/19/2015 FINDINGS: The lungs are well-aerated and clear. Density overlying the left lung base is thought to reflect a lead. There is no evidence of focal opacification, pleural effusion or pneumothorax. The heart is normal in size; the mediastinal contour is within normal limits. No acute osseous abnormalities are seen. IMPRESSION: No acute cardiopulmonary process seen. No displaced rib fractures identified. Electronically Signed   By: Garald Balding M.D.    On: 11/06/2015 01:26   Dg Elbow Complete Left  11/06/2015  CLINICAL DATA:  Acute onset of confusion. Recent fall, with laceration at the posterior left elbow. Initial encounter. EXAM: LEFT ELBOW - COMPLETE 3+ VIEW COMPARISON:  Left elbow radiographs performed 08/18/2004 FINDINGS: There is no evidence of fracture or dislocation. The visualized plate and screws along the olecranon is grossly intact, without evidence of loosening. The visualized joint spaces are preserved. No significant joint effusion is identified. The soft tissues are unremarkable in appearance. IMPRESSION: No evidence of fracture or dislocation. Hardware appears grossly intact. Electronically Signed   By: Garald Balding M.D.   On: 11/06/2015 01:23   Ct Head Wo Contrast  11/06/2015  CLINICAL DATA:  Acute onset of left upper and lower extremity weakness and numbness. Status post fall. Left facial numbness. Concern for cervical spine injury. Initial encounter. EXAM: CT HEAD WITHOUT CONTRAST CT CERVICAL SPINE WITHOUT CONTRAST TECHNIQUE: Multidetector CT imaging of the head and cervical spine was performed following the standard protocol without intravenous contrast. Multiplanar CT image reconstructions of the cervical spine were also generated. COMPARISON:  CT of the head and cervical spine performed 10/19/2015 FINDINGS: CT HEAD FINDINGS There is no evidence of acute infarction, mass lesion, or intra- or extra-axial hemorrhage on CT. Prominence of the ventricles and sulci suggests mild cortical volume loss. Mild cerebellar atrophy is noted. Scattered periventricular and subcortical white matter change likely reflects small vessel ischemic microangiopathy. Chronic lacunar infarcts are seen at the basal ganglia bilaterally. The brainstem and fourth ventricle are within normal limits. The cerebral hemispheres demonstrate grossly normal gray-white differentiation. No mass effect or midline shift is seen. There is no evidence of fracture; visualized  osseous structures are unremarkable in appearance. The orbits are within normal limits. Mucosal thickening is noted at the maxillary sinuses bilaterally. The remaining paranasal sinuses and mastoid air cells are well-aerated. No significant soft tissue abnormalities are seen. CT CERVICAL SPINE FINDINGS There is no evidence of fracture or subluxation. Vertebral bodies demonstrate normal height and alignment. Minimal multilevel disc space narrowing is noted along the cervical spine, with scattered endplate subcortical cystic change. Scattered small anterior and posterior disc osteophyte complexes are noted along the cervical spine. Prevertebral soft tissues are within normal limits. The thyroid gland is unremarkable in appearance. Emphysematous change is noted at the lung apices, with minimal associated scarring.  No significant soft tissue abnormalities are seen. IMPRESSION: 1. No evidence of traumatic intracranial injury or fracture. 2. No evidence of fracture or subluxation along the cervical spine. 3. Mild cortical volume loss and scattered small vessel ischemic microangiopathy. Chronic lacunar infarcts at the basal ganglia bilaterally. 4. Minimal degenerative change along the cervical spine. 5. Emphysematous change at the lung apices, with minimal associated scarring. Electronically Signed   By: Garald Balding M.D.   On: 11/06/2015 01:34   Ct Cervical Spine Wo Contrast  11/06/2015  CLINICAL DATA:  Acute onset of left upper and lower extremity weakness and numbness. Status post fall. Left facial numbness. Concern for cervical spine injury. Initial encounter. EXAM: CT HEAD WITHOUT CONTRAST CT CERVICAL SPINE WITHOUT CONTRAST TECHNIQUE: Multidetector CT imaging of the head and cervical spine was performed following the standard protocol without intravenous contrast. Multiplanar CT image reconstructions of the cervical spine were also generated. COMPARISON:  CT of the head and cervical spine performed 10/19/2015  FINDINGS: CT HEAD FINDINGS There is no evidence of acute infarction, mass lesion, or intra- or extra-axial hemorrhage on CT. Prominence of the ventricles and sulci suggests mild cortical volume loss. Mild cerebellar atrophy is noted. Scattered periventricular and subcortical white matter change likely reflects small vessel ischemic microangiopathy. Chronic lacunar infarcts are seen at the basal ganglia bilaterally. The brainstem and fourth ventricle are within normal limits. The cerebral hemispheres demonstrate grossly normal gray-white differentiation. No mass effect or midline shift is seen. There is no evidence of fracture; visualized osseous structures are unremarkable in appearance. The orbits are within normal limits. Mucosal thickening is noted at the maxillary sinuses bilaterally. The remaining paranasal sinuses and mastoid air cells are well-aerated. No significant soft tissue abnormalities are seen. CT CERVICAL SPINE FINDINGS There is no evidence of fracture or subluxation. Vertebral bodies demonstrate normal height and alignment. Minimal multilevel disc space narrowing is noted along the cervical spine, with scattered endplate subcortical cystic change. Scattered small anterior and posterior disc osteophyte complexes are noted along the cervical spine. Prevertebral soft tissues are within normal limits. The thyroid gland is unremarkable in appearance. Emphysematous change is noted at the lung apices, with minimal associated scarring. No significant soft tissue abnormalities are seen. IMPRESSION: 1. No evidence of traumatic intracranial injury or fracture. 2. No evidence of fracture or subluxation along the cervical spine. 3. Mild cortical volume loss and scattered small vessel ischemic microangiopathy. Chronic lacunar infarcts at the basal ganglia bilaterally. 4. Minimal degenerative change along the cervical spine. 5. Emphysematous change at the lung apices, with minimal associated scarring.  Electronically Signed   By: Garald Balding M.D.   On: 11/06/2015 01:34   Dg Knee Complete 4 Views Left  11/06/2015  CLINICAL DATA:  Acute onset of confusion. Status post fall, with anterior left knee laceration. Initial encounter. EXAM: LEFT KNEE - COMPLETE 4+ VIEW COMPARISON:  None. FINDINGS: There is no evidence of fracture or dislocation. The joint spaces are preserved. No significant degenerative change is seen; the patellofemoral joint is grossly unremarkable in appearance. A small knee joint effusion is suggested. The visualized soft tissues are otherwise unremarkable in appearance. IMPRESSION: 1. No evidence of fracture or dislocation. 2. Small knee joint effusion suggested. Electronically Signed   By: Garald Balding M.D.   On: 11/06/2015 01:25     EKG: Independently reviewed.     Assessment/Plan:     69 y.o. male with  Principal Problem:    CVA (cerebral infarction) vs TIA   CVA workup  MRI/MRA of Brain     Cardiac Monitoring    Neuro Checks    Check Fasting Lipids, and HbA1C    Carotid US and 2 D ECHO in AM    ASA Rx    PT/OT/Speech evals in AM    Seen By Neurology        Active Problems:    Left-sided weakness- due to #!   CT scan of Head Negative for acute findings      Fall   Fall precautions         Hypokalemia   Replace with IV KCl      Thrombocytopenia ([place])- due to Liver Disease   Monitor Trend      Alcohol abuse- reports No Alcohol x 2 weeks   Monitor    DVT Prophylaxis   SCDs    Other   Social Work Consult due to Homelessness    Code Status:     FULL CODE      Family Communication:  No Family Present    Disposition Plan:    Inpatient Status        Time spent:  [place] [facility] Pager [phone]   If Lea Please Contact the Day Rounding Team MD for [facility]  If 7PM-7AM, Please Contact Night-Floor Coverage  [email or web address] Password [email or web address] 11/06/2015, 3:32 AM     ADDENDUM:    Patient was seen and examined on 11/06/2015

## 2015-11-06 NOTE — Progress Notes (Signed)
Echocardiogram 2D Echocardiogram limited has been performed.  Tyler Pierce 11/06/2015, 11:27 AM

## 2015-11-07 ENCOUNTER — Inpatient Hospital Stay (HOSPITAL_COMMUNITY): Payer: Medicare HMO

## 2015-11-07 DIAGNOSIS — E785 Hyperlipidemia, unspecified: Secondary | ICD-10-CM

## 2015-11-07 DIAGNOSIS — I69354 Hemiplegia and hemiparesis following cerebral infarction affecting left non-dominant side: Secondary | ICD-10-CM

## 2015-11-07 DIAGNOSIS — IMO0002 Reserved for concepts with insufficient information to code with codable children: Secondary | ICD-10-CM | POA: Insufficient documentation

## 2015-11-07 DIAGNOSIS — I63311 Cerebral infarction due to thrombosis of right middle cerebral artery: Secondary | ICD-10-CM

## 2015-11-07 DIAGNOSIS — R471 Dysarthria and anarthria: Secondary | ICD-10-CM

## 2015-11-07 DIAGNOSIS — M6289 Other specified disorders of muscle: Secondary | ICD-10-CM

## 2015-11-07 DIAGNOSIS — I639 Cerebral infarction, unspecified: Principal | ICD-10-CM

## 2015-11-07 DIAGNOSIS — I69991 Dysphagia following unspecified cerebrovascular disease: Secondary | ICD-10-CM

## 2015-11-07 DIAGNOSIS — F1010 Alcohol abuse, uncomplicated: Secondary | ICD-10-CM

## 2015-11-07 DIAGNOSIS — Z720 Tobacco use: Secondary | ICD-10-CM | POA: Insufficient documentation

## 2015-11-07 LAB — BASIC METABOLIC PANEL
Anion gap: 9 (ref 5–15)
BUN: 11 mg/dL (ref 6–20)
CALCIUM: 8.1 mg/dL — AB (ref 8.9–10.3)
CO2: 23 mmol/L (ref 22–32)
CREATININE: 0.96 mg/dL (ref 0.61–1.24)
Chloride: 106 mmol/L (ref 101–111)
Glucose, Bld: 71 mg/dL (ref 65–99)
Potassium: 3.7 mmol/L (ref 3.5–5.1)
SODIUM: 138 mmol/L (ref 135–145)

## 2015-11-07 LAB — PHOSPHORUS: PHOSPHORUS: 2.9 mg/dL (ref 2.5–4.6)

## 2015-11-07 LAB — MAGNESIUM: MAGNESIUM: 1.9 mg/dL (ref 1.7–2.4)

## 2015-11-07 MED ORDER — ASPIRIN 325 MG PO TABS
325.0000 mg | ORAL_TABLET | Freq: Every day | ORAL | Status: DC
  Administered 2015-11-08 – 2015-11-09 (×2): 325 mg via ORAL
  Filled 2015-11-07 (×2): qty 1

## 2015-11-07 MED ORDER — ATORVASTATIN CALCIUM 10 MG PO TABS
10.0000 mg | ORAL_TABLET | Freq: Every day | ORAL | Status: DC
  Administered 2015-11-07 – 2015-11-09 (×3): 10 mg via ORAL
  Filled 2015-11-07 (×2): qty 1

## 2015-11-07 NOTE — Progress Notes (Signed)
MBSS complete. Full report located under chart review in imaging section.  Hamsa Laurich MA, CCC-SLP [phone]   

## 2015-11-07 NOTE — Consult Note (Signed)
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 right handed male with unremarkable past medical history except tobacco abuse on no prescription medications. Patient lives alone in a shed with no running water but does have electricity. No local family. Independent prior to admission. Presented 11/06/2015 with left-sided weakness, slurred speech and fall secondary to weakness. 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 MRA demonstrates distal medium and small vessel disease without significant stenosis or occlusion. Echocardiogram with normal systolic function. No obvious abnormalities of the aortic or mitral valve. Carotid Dopplers with no ICA stenosis. Patient did not receive TPA. Neurology consulted on aspirin for CVA prophylaxis as well as subcutaneous Lovenox for DVT prophylaxis. Occupational therapy evaluation completed with recommendations of physical medicine rehabilitation consult.   Review of Systems  Constitutional: Negative for fever and chills.  HENT: Negative for hearing loss.   Eyes: Negative for blurred vision and double vision.  Respiratory: Positive for cough. Negative for shortness of breath.   Cardiovascular: Negative for chest pain and leg swelling.  Gastrointestinal: Positive for constipation. Negative for nausea and vomiting.  Genitourinary: Negative for dysuria and hematuria.  Musculoskeletal: Positive for myalgias.  Skin: Negative for rash.  Neurological: Positive for focal weakness and weakness. Negative for seizures, loss of consciousness and headaches.  All other systems reviewed and are negative.  History reviewed. No pertinent past medical history. Past Surgical History  Procedure Laterality Date  . Finger  amputation     History reviewed. No pertinent family history. Social History:  reports that he quit smoking about 2 weeks ago. His smoking use included Cigarettes. He has a 50 pack-year smoking history. He has never used smokeless tobacco. He reports that he drinks alcohol. He reports that he does not use illicit drugs. Allergies:  Allergies  Allergen Reactions  . Penicillins     Loss of consciousness  Has patient had a PCN reaction causing immediate rash, facial/tongue/throat swelling, SOB or lightheadedness with hypotension: Yes Has patient had a PCN reaction causing severe rash involving mucus membranes or skin necrosis: No Has patient had a PCN reaction that required hospitalization No Has patient had a PCN reaction occurring within the last 10 years: No If all of the above answers are "NO", then may proceed with Cephalosporin use.   No prescriptions prior to admission    Home: [place] expects to be discharged to:: Shelter/Homeless (Shed) Additional Comments: Pt lives in a shed with no running water. Pt has a refrigerator, heater, pull-out bed, and RW. Pt voids outside.  Functional History: Prior Function Level of Independence: Independent Comments: Not working or driving Functional Status:  Mobility: Bed Mobility General bed mobility comments: Pt up in chair finishing with OT Transfers Overall transfer level: Needs assistance Equipment used: None Transfers: Sit to/from Stand Sit to Stand: Min assist General transfer comment: Min assist for balance upon standing. Pt with safe hand placement on seated surface and required no physical assist to power up to standing position. Ambulation/Gait Ambulation/Gait assistance: Min assist Ambulation Distance (Feet): 100 Feet Assistive device: 1 person hand held assist, Rolling walker (2 wheeled) Gait Pattern/deviations: Step-through pattern, Decreased stance time - right, Decreased weight shift to right, Wide base  of support (Lt foot begins to drag with fatigue) General Gait Details:  did well with HHA, however pt felt he needed RW for safety; able to improve Lt foot dragging with vc for heelstrike  Gait velocity interpretation: Below normal speed for age/gender    ADL: ADL Overall ADL's : Needs assistance/impaired Grooming: Wash/dry face, Oral care, Minimal assistance, Cueing for compensatory techniques, Sitting Toilet Transfer: Minimal assistance, Ambulation, Comfort height toilet Toilet Transfer Details (indicate cue type and reason): simulated Toileting- Clothing Manipulation and Hygiene: Minimal assistance, Sit to/from stand Functional mobility during ADLs: Minimal assistance General ADL Comments: Demonstrates good awareness and incorporates use of LUE during ADL tasks. Min assist for use and coordination of LUE during tasks. Began education on using LUE to stabilize objects during tasks. Min assist for sit-stand transfer and no use of AD initially but reached for RW and reported feeling safer using it.  Cognition: Cognition Overall Cognitive Status: Within Functional Limits for tasks assessed Orientation Level: Oriented X4 Cognition Arousal/Alertness: Awake/alert Behavior During Therapy: Flat affect Overall Cognitive Status: Within Functional Limits for tasks assessed  Blood pressure 122/64, pulse 63, temperature 97.8 F (36.6 C), temperature source Oral, resp. rate 20, height 6' (1.829 m), weight 69.718 kg (153 lb 11.2 oz), SpO2 93 %. Physical Exam  Constitutional: He is oriented to person, place, and time. He appears well-developed and well-nourished.  HENT:  Head: Normocephalic and atraumatic.  Left facial droop  Eyes: Conjunctivae and EOM are normal.  Neck: Normal range of motion. Neck supple. No thyromegaly present.  Cardiovascular: Normal rate and regular rhythm.   Respiratory: Effort normal and breath sounds normal. No respiratory distress.  GI: Soft. Bowel sounds are normal. He  exhibits no distension.  Musculoskeletal: He exhibits no edema or tenderness.  PROM WNL  Neurological: He is alert and oriented to person, place, and time.  Makes good eye contact with examiner.  Follows simple commands.  Fair awareness of deficits. Sensation intact to light touch Motor: RUE/RLE: 5/5 proximal distal LUE shoulder abduction, elbow flexion/extension 4 -/5, hand grip 3/5 LLE hip flexion -/5, ankle dorsi/plantar flexion 4+/5 Sensation intact to light touch Left hemi-facial weakness Dysarthria 3+ DTRs LUE  Skin: Skin is warm and dry.  Psychiatric: He has a normal mood and affect. His behavior is normal.    Results for orders placed or performed during the hospital encounter of 11/05/15 (from the past 24 hour(s))  Basic metabolic panel     Status: Abnormal   Collection Time: 11/07/15  6:10 AM  Result Value Ref Range   Sodium 138 135 - 145 mmol/L   Potassium 3.7 3.5 - 5.1 mmol/L   Chloride 106 101 - 111 mmol/L   CO2 23 22 - 32 mmol/L   Glucose, Bld 71 65 - 99 mg/dL   BUN 11 6 - 20 mg/dL   Creatinine, Ser 0.96 0.61 - 1.24 mg/dL   Calcium 8.1 (L) 8.9 - 10.3 mg/dL   GFR calc non Af Amer >60 >60 mL/min   GFR calc Af Amer >60 >60 mL/min   Anion gap 9 5 - 15  Magnesium     Status: None   Collection Time: 11/07/15  6:10 AM  Result Value Ref Range   Magnesium 1.9 1.7 - 2.4 mg/dL  Phosphorus     Status: None   Collection Time: 11/07/15  6:10 AM  Result Value Ref Range   Phosphorus 2.9 2.5 - 4.6 mg/dL   Dg Chest 2 View  11/06/2015  CLINICAL DATA:  Acute onset of confusion. Recent fall, with concern for chest injury. Cough.  Initial encounter. EXAM: CHEST  2 VIEW COMPARISON:  Chest radiograph performed 10/19/2015 FINDINGS: The lungs are well-aerated and clear. Density overlying the left lung base is thought to reflect a lead. There is no evidence of focal opacification, pleural effusion or pneumothorax. The heart is normal in size; the mediastinal contour is within normal  limits. No acute osseous abnormalities are seen. IMPRESSION: No acute cardiopulmonary process seen. No displaced rib fractures identified. Electronically Signed   By: Garald Balding M.D.   On: 11/06/2015 01:26   Dg Elbow Complete Left  11/06/2015  CLINICAL DATA:  Acute onset of confusion. Recent fall, with laceration at the posterior left elbow. Initial encounter. EXAM: LEFT ELBOW - COMPLETE 3+ VIEW COMPARISON:  Left elbow radiographs performed 08/18/2004 FINDINGS: There is no evidence of fracture or dislocation. The visualized plate and screws along the olecranon is grossly intact, without evidence of loosening. The visualized joint spaces are preserved. No significant joint effusion is identified. The soft tissues are unremarkable in appearance. IMPRESSION: No evidence of fracture or dislocation. Hardware appears grossly intact. Electronically Signed   By: Garald Balding M.D.   On: 11/06/2015 01:23   Ct Head Wo Contrast  11/06/2015  CLINICAL DATA:  Acute onset of left upper and lower extremity weakness and numbness. Status post fall. Left facial numbness. Concern for cervical spine injury. Initial encounter. EXAM: CT HEAD WITHOUT CONTRAST CT CERVICAL SPINE WITHOUT CONTRAST TECHNIQUE: Multidetector CT imaging of the head and cervical spine was performed following the standard protocol without intravenous contrast. Multiplanar CT image reconstructions of the cervical spine were also generated. COMPARISON:  CT of the head and cervical spine performed 10/19/2015 FINDINGS: CT HEAD FINDINGS There is no evidence of acute infarction, mass lesion, or intra- or extra-axial hemorrhage on CT. Prominence of the ventricles and sulci suggests mild cortical volume loss. Mild cerebellar atrophy is noted. Scattered periventricular and subcortical white matter change likely reflects small vessel ischemic microangiopathy. Chronic lacunar infarcts are seen at the basal ganglia bilaterally. The brainstem and fourth ventricle  are within normal limits. The cerebral hemispheres demonstrate grossly normal gray-white differentiation. No mass effect or midline shift is seen. There is no evidence of fracture; visualized osseous structures are unremarkable in appearance. The orbits are within normal limits. Mucosal thickening is noted at the maxillary sinuses bilaterally. The remaining paranasal sinuses and mastoid air cells are well-aerated. No significant soft tissue abnormalities are seen. CT CERVICAL SPINE FINDINGS There is no evidence of fracture or subluxation. Vertebral bodies demonstrate normal height and alignment. Minimal multilevel disc space narrowing is noted along the cervical spine, with scattered endplate subcortical cystic change. Scattered small anterior and posterior disc osteophyte complexes are noted along the cervical spine. Prevertebral soft tissues are within normal limits. The thyroid gland is unremarkable in appearance. Emphysematous change is noted at the lung apices, with minimal associated scarring. No significant soft tissue abnormalities are seen. IMPRESSION: 1. No evidence of traumatic intracranial injury or fracture. 2. No evidence of fracture or subluxation along the cervical spine. 3. Mild cortical volume loss and scattered small vessel ischemic microangiopathy. Chronic lacunar infarcts at the basal ganglia bilaterally. 4. Minimal degenerative change along the cervical spine. 5. Emphysematous change at the lung apices, with minimal associated scarring. Electronically Signed   By: Garald Balding M.D.   On: 11/06/2015 01:34   Ct Cervical Spine Wo Contrast  11/06/2015  CLINICAL DATA:  Acute onset of left upper and lower extremity weakness and numbness. Status post fall. Left facial numbness.  Concern for cervical spine injury. Initial encounter. EXAM: CT HEAD WITHOUT CONTRAST CT CERVICAL SPINE WITHOUT CONTRAST TECHNIQUE: Multidetector CT imaging of the head and cervical spine was performed following the  standard protocol without intravenous contrast. Multiplanar CT image reconstructions of the cervical spine were also generated. COMPARISON:  CT of the head and cervical spine performed 10/19/2015 FINDINGS: CT HEAD FINDINGS There is no evidence of acute infarction, mass lesion, or intra- or extra-axial hemorrhage on CT. Prominence of the ventricles and sulci suggests mild cortical volume loss. Mild cerebellar atrophy is noted. Scattered periventricular and subcortical white matter change likely reflects small vessel ischemic microangiopathy. Chronic lacunar infarcts are seen at the basal ganglia bilaterally. The brainstem and fourth ventricle are within normal limits. The cerebral hemispheres demonstrate grossly normal gray-white differentiation. No mass effect or midline shift is seen. There is no evidence of fracture; visualized osseous structures are unremarkable in appearance. The orbits are within normal limits. Mucosal thickening is noted at the maxillary sinuses bilaterally. The remaining paranasal sinuses and mastoid air cells are well-aerated. No significant soft tissue abnormalities are seen. CT CERVICAL SPINE FINDINGS There is no evidence of fracture or subluxation. Vertebral bodies demonstrate normal height and alignment. Minimal multilevel disc space narrowing is noted along the cervical spine, with scattered endplate subcortical cystic change. Scattered small anterior and posterior disc osteophyte complexes are noted along the cervical spine. Prevertebral soft tissues are within normal limits. The thyroid gland is unremarkable in appearance. Emphysematous change is noted at the lung apices, with minimal associated scarring. No significant soft tissue abnormalities are seen. IMPRESSION: 1. No evidence of traumatic intracranial injury or fracture. 2. No evidence of fracture or subluxation along the cervical spine. 3. Mild cortical volume loss and scattered small vessel ischemic microangiopathy. Chronic  lacunar infarcts at the basal ganglia bilaterally. 4. Minimal degenerative change along the cervical spine. 5. Emphysematous change at the lung apices, with minimal associated scarring. Electronically Signed   By: Garald Balding M.D.   On: 11/06/2015 01:34   Mr Brain Wo Contrast  11/06/2015  CLINICAL DATA:  Sudden onset of left-sided weakness involving the face, upper and lower extremity beginning over 24 hours ago. The patient fell due to weakness. EXAM: MRI HEAD WITHOUT CONTRAST MRA HEAD WITHOUT CONTRAST TECHNIQUE: Multiplanar, multiecho pulse sequences of the brain and surrounding structures were obtained without intravenous contrast. Angiographic images of the head were obtained using MRA technique without contrast. COMPARISON:  CT head without contrast 10/19/2015. FINDINGS: MRI HEAD FINDINGS An acute nonhemorrhagic infarct within the right basal ganglia measures 2.7 cm maximally. There is associated T2 signal change in edema with this infarct. A remote infarct in the left basal ganglia measures 10 mm. There is no acute hemorrhage or mass lesion. Moderate to severe diffuse periventricular and subcortical white matter changes are evident bilaterally. Additional smaller remote lacunar infarcts are present within the basal ganglia bilaterally. The ventricles are proportionate to the degree of atrophy. No significant extra-axial fluid collection is present. The brainstem and cerebellum are unremarkable. The right vertebral artery is hypoplastic. Flow is present in the major intracranial arteries. The globes and orbits are intact. There is near complete opacification of the left maxillary sinus. Prominent mucosal thickening is noted along the floor of the right maxillary sinus with a small fluid level. The paranasal sinuses and mastoid air cells are otherwise clear. The skullbase is within normal limits. Midline sagittal images are unremarkable MRA HEAD FINDINGS The internal carotid arteries are within normal  limits from  the high cervical segments through the ICA termini bilaterally. The A1 and M1 segments are intact. The anterior communicating artery is patent. The MCA bifurcations are intact. Moderate medium and small vessel attenuation is symmetric. The left vertebral artery is the dominant vessel. The right vertebral artery is hypoplastic above the dural margin. The basilar artery is intact. Both posterior cerebral arteries originate from the basilar tip. There is moderate attenuation of distal PCA branch vessels bilaterally. IMPRESSION: 1. 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 2. Remote 10 mm nonhemorrhagic infarct of the left basal ganglia in a similar location. 3. Additional smaller remote lacunar infarcts are present in the basal ganglia bilaterally. 4. Advanced diffuse confluent periventricular and subcortical white matter disease bilaterally compatible with severe microvascular ischemia. 5. The MRA demonstrates diffuse distal medium and small vessel disease without significant proximal stenosis, aneurysm, or branch vessel occlusion. These results will be called to the ordering clinician or representative by the Radiologist Assistant, and communication documented in the PACS or zVision Dashboard. Electronically Signed   By: San Morelle M.D.   On: 11/06/2015 08:30   Dg Knee Complete 4 Views Left  11/06/2015  CLINICAL DATA:  Acute onset of confusion. Status post fall, with anterior left knee laceration. Initial encounter. EXAM: LEFT KNEE - COMPLETE 4+ VIEW COMPARISON:  None. FINDINGS: There is no evidence of fracture or dislocation. The joint spaces are preserved. No significant degenerative change is seen; the patellofemoral joint is grossly unremarkable in appearance. A small knee joint effusion is suggested. The visualized soft tissues are otherwise unremarkable in appearance. IMPRESSION: 1. No evidence of fracture or dislocation. 2. Small knee joint  effusion suggested. Electronically Signed   By: Garald Balding M.D.   On: 11/06/2015 01:25   Dg Swallowing Func-speech Pathology  11/07/2015  Objective Swallowing Evaluation:   MBS Patient Details Name: TAHJAE CLAUSING MRN: [id number] Date of Birth: 04/13/1946 Today's Date: 11/07/2015 Time: SLP Start Time (ACUTE ONLY): 1330-SLP Stop Time (ACUTE ONLY): 1346 SLP Time Calculation (min) (ACUTE ONLY): 16 min Past Medical History: No past medical history on file. Past Surgical History: Past Surgical History Procedure Laterality Date . Finger amputation   HPI: CHRIST FULLENWIDER is an 69 y.o. male with no reported past medical history presenting with sudden onset of left sided weakness. Out of tPA window. MRI: acute nonhemorrhagic infarct within the right basal ganglia No Data Recorded Assessment / Plan / Recommendation CHL IP CLINICAL IMPRESSIONS 11/07/2015 Therapy Diagnosis Moderate oral phase dysphagia;Moderate pharyngeal phase dysphagia Clinical Impression Patient presents with a moderate oropharyngeal dysphagia characterized by CN VII and XII involvement impacting left sided facial/lingual strength and sensory impairments. Combination of above results in anterior labial loss of bolus, delayed oral transit of bolus, and delayed swallow initiation with deep penetration of liquids thinner than honey thick. Penetration sensed only when reaches the level of the vocal cords and cough response inconsistent to clear. Cues for chin tuck ineffective to increase airway protection. Recommend initiation of conservative diet with close SLP f/u to monitor for diet tolerance and potential to advance.   Impact on safety and function Moderate aspiration risk   CHL IP TREATMENT RECOMMENDATION 11/07/2015 Treatment Recommendations Therapy as outlined in treatment plan below   Prognosis 11/07/2015 Prognosis for Safe Diet Advancement Good Barriers to Reach Goals -- Barriers/Prognosis Comment -- CHL IP DIET RECOMMENDATION 11/07/2015 SLP Diet  Recommendations Dysphagia 2 (Fine chop) solids;Honey thick liquids Liquid Administration via Spoon Medication Administration Crushed with  puree Compensations Slow rate;Small sips/bites Postural Changes Seated upright at 90 degrees   CHL IP OTHER RECOMMENDATIONS 11/07/2015 Recommended Consults -- Oral Care Recommendations Oral care BID Other Recommendations Order thickener from pharmacy;Prohibited food (jello, ice cream, thin soups);Remove water pitcher   CHL IP FOLLOW UP RECOMMENDATIONS 11/07/2015 Follow up Recommendations Inpatient Rehab   CHL IP FREQUENCY AND DURATION 11/07/2015 Speech Therapy Frequency (ACUTE ONLY) min 2x/week Treatment Duration 2 weeks      CHL IP ORAL PHASE 11/07/2015 Oral Phase Impaired Oral - Pudding Teaspoon -- Oral - Pudding Cup -- Oral - Honey Teaspoon Left anterior bolus loss;Lingual/palatal residue;Delayed oral transit Oral - Honey Cup -- Oral - Nectar Teaspoon Left anterior bolus loss;Lingual/palatal residue;Delayed oral transit Oral - Nectar Cup Left anterior bolus loss;Lingual/palatal residue;Delayed oral transit Oral - Nectar Straw -- Oral - Thin Teaspoon -- Oral - Thin Cup Left anterior bolus loss;Lingual/palatal residue;Delayed oral transit Oral - Thin Straw -- Oral - Puree Lingual/palatal residue;Delayed oral transit Oral - Mech Soft Lingual/palatal residue;Delayed oral transit Oral - Regular -- Oral - Multi-Consistency -- Oral - Pill -- Oral Phase - Comment --  CHL IP PHARYNGEAL PHASE 11/07/2015 Pharyngeal Phase Impaired Pharyngeal- Pudding Teaspoon -- Pharyngeal -- Pharyngeal- Pudding Cup -- Pharyngeal -- Pharyngeal- Honey Teaspoon Delayed swallow initiation-pyriform sinuses Pharyngeal -- Pharyngeal- Honey Cup -- Pharyngeal -- Pharyngeal- Nectar Teaspoon Delayed swallow initiation-pyriform sinuses;Penetration/Aspiration before swallow;Penetration/Aspiration during swallow Pharyngeal Material enters airway, CONTACTS cords and not ejected out Pharyngeal- Nectar Cup Delayed swallow  initiation-pyriform sinuses;Penetration/Aspiration before swallow;Penetration/Aspiration during swallow Pharyngeal Material enters airway, CONTACTS cords and not ejected out Pharyngeal- Nectar Straw -- Pharyngeal -- Pharyngeal- Thin Teaspoon -- Pharyngeal -- Pharyngeal- Thin Cup Delayed swallow initiation-pyriform sinuses;Penetration/Aspiration before swallow;Penetration/Aspiration during swallow Pharyngeal Material enters airway, CONTACTS cords and not ejected out Pharyngeal- Thin Straw -- Pharyngeal -- Pharyngeal- Puree Delayed swallow initiation-vallecula Pharyngeal -- Pharyngeal- Mechanical Soft Delayed swallow initiation-vallecula Pharyngeal -- Pharyngeal- Regular -- Pharyngeal -- Pharyngeal- Multi-consistency -- Pharyngeal -- Pharyngeal- Pill -- Pharyngeal -- Pharyngeal Comment --  CHL IP CERVICAL ESOPHAGEAL PHASE 11/07/2015 Cervical Esophageal Phase WFL Pudding Teaspoon -- Pudding Cup -- Honey Teaspoon -- Honey Cup -- Nectar Teaspoon -- Nectar Cup -- Nectar Straw -- Thin Teaspoon -- Thin Cup -- Thin Straw -- Puree -- Mechanical Soft -- Regular -- Multi-consistency -- Pill -- Cervical Esophageal Comment -- Gabriel Rainwater MA, CCC-SLP [phone] Gabriel Rainwater [name] 11/07/2015, 1:58 PM              Mr Jodene Nam Head/brain Wo Cm  11/06/2015  CLINICAL DATA:  Sudden onset of left-sided weakness involving the face, upper and lower extremity beginning over 24 hours ago. The patient fell due to weakness. EXAM: MRI HEAD WITHOUT CONTRAST MRA HEAD WITHOUT CONTRAST TECHNIQUE: Multiplanar, multiecho pulse sequences of the brain and surrounding structures were obtained without intravenous contrast. Angiographic images of the head were obtained using MRA technique without contrast. COMPARISON:  CT head without contrast 10/19/2015. FINDINGS: MRI HEAD FINDINGS An acute nonhemorrhagic infarct within the right basal ganglia measures 2.7 cm maximally. There is associated T2 signal change in edema with this infarct. A remote infarct in  the left basal ganglia measures 10 mm. There is no acute hemorrhage or mass lesion. Moderate to severe diffuse periventricular and subcortical white matter changes are evident bilaterally. Additional smaller remote lacunar infarcts are present within the basal ganglia bilaterally. The ventricles are proportionate to the degree of atrophy. No significant extra-axial fluid collection is present. The brainstem and cerebellum are unremarkable. The right vertebral  artery is hypoplastic. Flow is present in the major intracranial arteries. The globes and orbits are intact. There is near complete opacification of the left maxillary sinus. Prominent mucosal thickening is noted along the floor of the right maxillary sinus with a small fluid level. The paranasal sinuses and mastoid air cells are otherwise clear. The skullbase is within normal limits. Midline sagittal images are unremarkable MRA HEAD FINDINGS The internal carotid arteries are within normal limits from the high cervical segments through the ICA termini bilaterally. The A1 and M1 segments are intact. The anterior communicating artery is patent. The MCA bifurcations are intact. Moderate medium and small vessel attenuation is symmetric. The left vertebral artery is the dominant vessel. The right vertebral artery is hypoplastic above the dural margin. The basilar artery is intact. Both posterior cerebral arteries originate from the basilar tip. There is moderate attenuation of distal PCA branch vessels bilaterally. IMPRESSION: 1. 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 2. Remote 10 mm nonhemorrhagic infarct of the left basal ganglia in a similar location. 3. Additional smaller remote lacunar infarcts are present in the basal ganglia bilaterally. 4. Advanced diffuse confluent periventricular and subcortical white matter disease bilaterally compatible with severe microvascular ischemia. 5. The MRA demonstrates diffuse distal  medium and small vessel disease without significant proximal stenosis, aneurysm, or branch vessel occlusion. These results will be called to the ordering clinician or representative by the Radiologist Assistant, and communication documented in the PACS or zVision Dashboard. Electronically Signed   By: San Morelle M.D.   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  Records reviewed and summated above. Stroke: Continue secondary stroke prophylaxis and Risk Factor Modification listed below:   Antiplatelet therapy Blood Pressure Management:  Continue current medication with prn's with permisive HTN per primary team Statin Agent Tobacco abuse:  Cont counseling Left hemiparesis: Cont therapies Motor recovery: Fluoxetine  1. Does the need for close, 24 hr/day medical supervision in concert with the patient's rehab needs make it unreasonable for this patient to be served in a less intensive setting? Potentially  2. Co-Morbidities requiring supervision/potential complications:  tobacco abuse (cont to counsel), dysphagia (cont SLP, consider Vital Stim), dysarthria (cont SLP) 3. Due to safety, disease management and patient education, does the patient require 24 hr/day rehab nursing? Potentially 4. Does the patient require coordinated care of a physician, rehab nurse, PT (1-2 hrs/day, 5 days/week), OT (1-2 hrs/day, 5 days/week) and SLP (1-2 hrs/day, 5 days/week) to address physical and functional deficits in the context of the above medical diagnosis(es)? Potentially Addressing deficits in the following areas: balance, endurance, locomotion, strength, transferring, toileting, speech and psychosocial support 5. Can the patient actively participate in an intensive therapy program of at least 3 hrs of therapy per day at least 5 days per week? Yes 6. The potential for  patient to make measurable gains while on inpatient rehab is excellent 7. Anticipated functional outcomes upon discharge from inpatient rehab are modified independent  with PT, modified independent with OT, n/a with SLP. 8. Estimated rehab length of stay to reach the above functional goals is: 16-19 days. 9. Does the patient have adequate social supports and living environment to accommodate these discharge functional goals? Yes 10. Anticipated D/C setting: Home 11. Anticipated post D/C treatments: HH therapy and Home excercise program 12. Overall Rehab/Functional Prognosis: good  RECOMMENDATIONS: This patient's condition  is appropriate for continued rehabilitative care in the following setting: CIR Patient has agreed to participate in recommended program. Yes Note that insurance prior authorization may be required for reimbursement for recommended care.  Comment: Rehab Admissions Coordinator to follow up.  Delice Lesch, MD 11/07/2015

## 2015-11-07 NOTE — Progress Notes (Signed)
Occupational Therapy Evaluation Patient Details Name: Tyler Pierce MRN: [id number] DOB: [birth year] Today's Date: 11/07/2015    History of Present Illness 69 y.o. male admitted for L sided weakness and numbness beginning on 11/05/15 and fell as a result of his symptoms. MRI (+) for acute R basal ganglia infarct and remote L basal ganglia infarct.   Clinical Impression   PTA, pt was independent with all ADLs and mobility. Pt currently presents with L hemiparesis and decreased sensation and required min assist for ADLs, functional transfers, and mobility. Pt demonstrating good awareness and attempts to use LUE during all ADLs and mobility. Feel as though pt will benefit from CIR level post-acute rehab stay to increase independence with ADLs and mobility to allow safe d/c home.     Follow Up Recommendations  CIR    Equipment Recommendations  Other (comment) (TBD in next venue) - will likely need BSC, pt has no bathroom/running water at home   Recommendations for Other Services Rehab consult     Precautions / Restrictions Precautions Precautions: Fall Restrictions Weight Bearing Restrictions: No      Mobility Bed Mobility               General bed mobility comments: Pt up in chair on OT arrival  Transfers Overall transfer level: Needs assistance Equipment used: None Transfers: Sit to/from Stand Sit to Stand: Min assist         General transfer comment: Min assist for balance upon standing. Pt with safe hand placement on seated surface and required no physical assist to power up to standing position.    Balance Overall balance assessment: Needs assistance Sitting-balance support: No upper extremity supported;Feet supported Sitting balance-Leahy Scale: Fair     Standing balance support: No upper extremity supported;During functional activity Standing balance-Leahy Scale: Poor Standing balance comment: Able to m                            ADL  Overall ADL's : Needs assistance/impaired     Grooming: Wash/dry face;Oral care;Minimal assistance;Cueing for compensatory techniques;Sitting                   Toilet Transfer: Minimal assistance;Ambulation;Comfort height toilet Toilet Transfer Details (indicate cue type and reason): simulated Toileting- Clothing Manipulation and Hygiene: Minimal assistance;Sit to/from stand       Functional mobility during ADLs: Minimal assistance General ADL Comments: Demonstrates good awareness and incorporates use of LUE during ADL tasks. Min assist for use and coordination of LUE during tasks. Began education on using LUE to stabilize objects during tasks. Min assist for sit-stand transfer and no use of AD initially but reached for RW and reported feeling safer using it.     Vision Vision Assessment?: Yes Eye Alignment: Within Functional Limits Ocular Range of Motion: Within Functional Limits Alignment/Gaze Preference: Within Defined Limits Tracking/Visual Pursuits: Decreased smoothness of horizontal tracking;Decreased smoothness of vertical tracking;Requires cues, head turns, or add eye shifts to track Saccades: Decreased speed of saccadic movement;Additional head turns occurred during testing Convergence: Impaired (comment) (Minimal eye movement observed) Visual Fields: No apparent deficits   Perception     Praxis      Pertinent Vitals/Pain Pain Assessment: Faces Faces Pain Scale: Hurts a little bit Pain Location: chest Pain Descriptors / Indicators: Aching;Discomfort Pain Intervention(s): Monitored during session;Repositioned     Hand Dominance Right   Extremity/Trunk Assessment Upper Extremity Assessment Upper Extremity Assessment: LUE deficits/detail LUE Deficits /  Details: AROM: shoulder 90 degrees, elbow wfl, decreased composite digit flexion/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), forearm supination 30 degrees; STRENGTH: decreased grip strength, biceps 3/5, triceps 3/5,  deltoids 3/5; SENSATION: numbness entire UE  LUE Sensation: decreased light touch LUE Coordination: decreased fine motor;decreased gross motor   Lower Extremity Assessment Lower Extremity Assessment: Defer to PT evaluation   Cervical / Trunk Assessment Cervical / Trunk Assessment: Kyphotic   Communication Communication Communication: Expressive difficulties (Wet souding voice and quiet)   Cognition Arousal/Alertness: Awake/alert Behavior During Therapy: Flat affect Overall Cognitive Status: Within Functional Limits for tasks assessed                     General Comments       Exercises       Shoulder Instructions      Home Living Family/patient expects to be discharged to:: Other (Comment) (Shed)                                 Additional Comments: Pt lives in a shed with no running water. Pt has a refrigerator, heater, pull-out bed, and RW. Pt voids outside.      Prior Functioning/Environment Level of Independence: Independent        Comments: Not working or driving    OT Diagnosis: Hemiplegia non-dominant side   OT Problem List: Decreased strength;Decreased range of motion;Decreased activity tolerance;Impaired balance (sitting and/or standing);Decreased coordination;Decreased safety awareness;Decreased knowledge of use of DME or AE;Decreased knowledge of precautions;Impaired sensation;Impaired tone;Impaired UE functional use   OT Treatment/Interventions: Self-care/ADL training;Therapeutic exercise;Neuromuscular education;Energy conservation;DME and/or AE instruction;Therapeutic activities;Cognitive remediation/compensation;Patient/family education;Balance training    OT Goals(Current goals can be found in the care plan section) Acute Rehab OT Goals Patient Stated Goal: to get stronger and be independent OT Goal Formulation: With patient Time For Goal Achievement: 11/21/15 Potential to Achieve Goals: Good ADL Goals Pt Will Perform Grooming:  with modified independence;sitting (incorporating LUE with no verbal cues.) Pt Will Perform Upper Body Dressing: with modified independence;sitting (incorporating LUE with no verbal cues.) Pt Will Perform Lower Body Dressing: with modified independence;with adaptive equipment;sit to/from stand Pt Will Transfer to Toilet: with modified independence;ambulating;bedside commode Pt Will Perform Toileting - Clothing Manipulation and hygiene: with modified independence;sit to/from stand Additional ADL Goal #1: Pt will independently demonstrate LUE protection strategies.  OT Frequency: Min 3X/week   Barriers to D/C: Inaccessible home environment;Decreased caregiver support  Pt resides in a shed on a property with friends. Friends are only available intermittently for assistance (not sure of reliability).       Co-evaluation              End of Session Equipment Utilized During Treatment: Gait belt Nurse Communication: Mobility status;Other (comment) (Keep pillow under LUE at all times)  Activity Tolerance: Patient tolerated treatment well Patient left: Other (comment) (with PT)   Time: 4580-9983 OT Time Calculation (min): 22 min Charges:  OT General Charges $OT Visit: 1 Procedure OT Evaluation $Initial OT Evaluation Tier I: 1 Procedure OT Treatments $Self Care/Home Management : 8-22 mins G-Codes:    Redmond Baseman, OTR/L Nov 08, 2015, 10:19 AM Pager: [phone]

## 2015-11-07 NOTE — Progress Notes (Signed)
   11/07/15 1100  Clinical Encounter Type  Visited With Patient  Visit Type Follow-up  Referral From Nurse  Spiritual Encounters  Spiritual Needs Other (Comment) (None)  Consult entered for advanced directive. Patient had no idea what I was talking about, and when I explained it, he said he was not interested in preparing one.

## 2015-11-07 NOTE — Evaluation (Signed)
Physical Therapy Evaluation Patient Details Name: Tyler Pierce MRN: [id number] DOB: 12-17-1945 Today's Date: 11/07/2015   History of Present Illness  69 y.o. male admitted for L sided weakness and numbness beginning on 11/05/15 and fell as a result of his symptoms. MRI (+) for acute R basal ganglia infarct and remote L basal ganglia infarct.    Clinical Impression  Pt admitted with above diagnosis. Pt currently with functional limitations due to the deficits listed below (see PT Problem List).  Pt will benefit from skilled PT to increase their independence and safety with mobility to allow discharge to the venue listed below.       Follow Up Recommendations CIR    Equipment Recommendations  None recommended by PT    Recommendations for Other Services Rehab consult     Precautions / Restrictions Precautions Precautions: Fall Restrictions Weight Bearing Restrictions: No      Mobility  Bed Mobility               General bed mobility comments: Pt up in chair finishing with OT  Transfers Overall transfer level: Needs assistance Equipment used: None Transfers: Sit to/from Stand Sit to Stand: Min assist         General transfer comment: Min assist for balance upon standing. Pt with safe hand placement on seated surface and required no physical assist to power up to standing position.  Ambulation/Gait Ambulation/Gait assistance: Min assist Ambulation Distance (Feet): 100 Feet Assistive device: 1 person hand held assist;Rolling walker (2 wheeled) Gait Pattern/deviations: Step-through pattern;Decreased stance time - right;Decreased weight shift to right;Wide base of support (Lt foot begins to drag with fatigue)   Gait velocity interpretation: Below normal speed for age/gender General Gait Details: did well with HHA, however pt felt he needed RW for safety; able to improve Lt foot dragging with vc for heelstrike   Stairs            Wheelchair Mobility     Modified Rankin (Stroke Patients Only) Modified Rankin (Stroke Patients Only) Pre-Morbid Rankin Score: No symptoms Modified Rankin: Moderately severe disability     Balance Overall balance assessment: Needs assistance Sitting-balance support: No upper extremity supported;Feet supported Sitting balance-Leahy Scale: Fair     Standing balance support: No upper extremity supported Standing balance-Leahy Scale: Fair Standing balance comment: once standing can maintain static standing without support                             Pertinent Vitals/Pain Pain Assessment: Faces Faces Pain Scale: No hurt Pain Location: chest Pain Descriptors / Indicators: Aching;Discomfort Pain Intervention(s): Monitored during session;Repositioned    Home Living Family/patient expects to be discharged to:: Shelter/Homeless (Shed)                 Additional Comments: Pt lives in a shed with no running water. Pt has a refrigerator, heater, pull-out bed, and RW. Pt voids outside.    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), forearm supination 30 degrees; STRENGTH: decreased grip strength, biceps 3/5, triceps 3/5, deltoids 3/5; SENSATION: numbness entire UE    Lower  Extremity Assessment: Overall WFL for tasks assessed (knee extension and DF 5/5 bil)      Cervical / Trunk Assessment: Kyphotic  Communication   Communication: Expressive difficulties (Wet souding voice and quiet)  Cognition Arousal/Alertness: Awake/alert Behavior During Therapy: Flat affect Overall Cognitive Status: Within Functional Limits for tasks assessed                      General  Comments      Exercises        Assessment/Plan    PT Assessment Patient needs continued PT services  PT Diagnosis Hemiplegia non-dominant side   PT Problem List Decreased strength;Decreased balance;Decreased mobility;Decreased knowledge of use of DME;Decreased safety awareness  PT Treatment Interventions DME instruction;Gait training;Functional mobility training;Therapeutic activities;Balance training;Neuromuscular re-education;Patient/family education   PT Goals (Current goals can be found in the Care Plan section) Acute Rehab PT Goals Patient Stated Goal: to get stronger and be independent PT Goal Formulation: With patient Time For Goal Achievement: 11/14/15 Potential to Achieve Goals: Good    Frequency Min 4X/week   Barriers to discharge Decreased caregiver support;Inaccessible home environment      Co-evaluation               End of Session Equipment Utilized During Treatment: Gait belt Activity Tolerance: Patient tolerated treatment well Patient left: in chair;with call bell/phone within reach;with chair alarm set Nurse Communication: Mobility status         Time: 6226-3335 PT Time Calculation (min) (ACUTE ONLY): 12 min   Charges:   PT Evaluation $Initial PT Evaluation Tier I: 1 Procedure     PT G Codes:        Tyler Pierce 24-Nov-2015, 11:27 AM Pager [phone]

## 2015-11-07 NOTE — Progress Notes (Signed)
Speech Language Pathology Treatment: Dysphagia  Patient Details Name: Tyler Pierce MRN: [id number] DOB: January 21, 1946 Today's Date: 11/07/2015 Time: 7371-0626 SLP Time Calculation (min) (ACUTE ONLY): 10 min  Assessment / Plan / Recommendation Clinical Impression  Diagnostic treatment focused on readiness for a po diet and/or instrumental testing. Patient alert but continues to be slow to respond. Ability to protect airway with pureed solids appears improved with more timely oral transit of bolus and no overt s/s of aspiration. Patient continues to present with evidence of decreased airway protection with liquids however characterized by immediate strong cough response, despite trials of thickened liquids and cues for small sips. At this time, recommend instrumental testing to determine potential to resume pos today. Plan for MBS at 1330.    HPI HPI: Tyler Pierce is an 69 y.o. male with no reported past medical history presenting with sudden onset of left sided weakness. Out of tPA window. MRI: acute nonhemorrhagic infarct within the right basal ganglia      SLP Plan  MBS     Recommendations  Diet recommendations: NPO Medication Administration: Via alternative means              General recommendations: Rehab consult Oral Care Recommendations: Oral care QID Follow up Recommendations: Inpatient Rehab Plan: [facility], [place] [phone]  Tyler Pierce 11/07/2015, 12:08 PM

## 2015-11-07 NOTE — Progress Notes (Signed)
STROKE TEAM PROGRESS NOTE   HISTORY Tyler Pierce is an 69 y.o. male with no reported past medical history presenting with sudden onset of left sided weakness. LKW 0500 on 12/24. Involved face, arm and leg, notes having a faill due to the weakness. Reports being on the floor for 4 hours after the fall due to weakness. Notes no prior CVA or TIA. Not on a daily antiplatelet. Patient is homeless. Modified Rankin: Rankin Score=0. Patient was not administered TPA secondary to outside tPA window. He was admitted for further evaluation and treatment.   SUBJECTIVE (INTERVAL HISTORY) No family is at the bedside.  Overall he feels his condition is stable. 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    OBJECTIVE Temp:  [97.7 F (36.5 C)-98.4 F (36.9 C)] 98.3 F (36.8 C) (12/27 0117) Pulse Rate:  [58-71] 60 (12/27 0117) Cardiac Rhythm:  [-] Sinus bradycardia (12/26 1955) Resp:  [16-20] 20 (12/27 0117) BP: (119-151)/(53-73) 133/60 mmHg (12/27 0117) SpO2:  [93 %-100 %] 94 % (12/27 0117)  CBC:   Recent Labs Lab 11/06/15 0028 11/06/15 0036  WBC 6.4  --   NEUTROABS 3.8  --   HGB 12.9* 13.9  HCT 36.6* 41.0  MCV 108.9*  --   PLT 122*  --     Basic Metabolic Panel:   Recent Labs Lab 11/06/15 0028 11/06/15 0036 11/07/15 0610  NA 135 136 138  K 3.4* 3.2* 3.7  CL 100* 97* 106  CO2 25  --  23  GLUCOSE 96 92 71  BUN '10 11 11  '$ CREATININE 0.99 0.90 0.96  CALCIUM 8.5*  --  8.1*  MG  --   --  1.9  PHOS  --   --  2.9    Lipid Panel:     Component Value Date/Time   CHOL 124 11/06/2015 0433   TRIG 69 11/06/2015 0433   HDL 37* 11/06/2015 0433   CHOLHDL 3.4 11/06/2015 0433   VLDL 14 11/06/2015 0433   LDLCALC 73 11/06/2015 0433   HgbA1c: No results found for: HGBA1C Urine Drug Screen:     Component Value Date/Time   LABOPIA NONE DETECTED 11/06/2015 0035   COCAINSCRNUR NONE DETECTED 11/06/2015 0035   LABBENZ NONE DETECTED 11/06/2015 0035    AMPHETMU NONE DETECTED 11/06/2015 0035   THCU NONE DETECTED 11/06/2015 0035   LABBARB NONE DETECTED 11/06/2015 0035      IMAGING I have personally reviewed the radiological images below and agree with the radiology interpretations.  Dg Chest 2 View 11/06/2015   No acute cardiopulmonary process seen. No displaced rib fractures identified.   Dg Elbow Complete Left 01/06/2015  No evidence of fracture or dislocation. Hardware appears grossly intact.   Ct Head & Spine Wo Contrast 11/06/2015  1. No evidence of traumatic intracranial injury or fracture. 2. No evidence of fracture or subluxation along the cervical spine. 3. Mild cortical volume loss and scattered small vessel ischemic microangiopathy. Chronic lacunar infarcts at the basal ganglia bilaterally. 4. Minimal degenerative change along the cervical spine. 5. Emphysematous change at the lung apices, with minimal associated scarring.   Dg Knee Complete 4 Views Left 11/06/2015   1. No evidence of fracture or dislocation. 2. Small knee joint effusion suggested.   Mri & Mra Head/brain Wo Cm 11/06/2015   1. 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 2. Remote 10 mm nonhemorrhagic infarct of the left basal ganglia  in a similar location. 3. Additional smaller remote lacunar infarcts are present in the basal ganglia bilaterally. 4. Advanced diffuse confluent periventricular and subcortical white matter disease bilaterally compatible with severe microvascular ischemia. 5. The MRA demonstrates diffuse distal medium and small vessel disease without significant proximal stenosis, aneurysm, or branch vessel occlusion.   2D Echocardiogram  - Very poor quality images. The left ventricle is normal in size and thickness. Overall LV systolic function appears to be at lower limits of normal. Regional wall motion and diastolic function cannot be assessed. The left atrium is probably normal in size.Normal right ventricular  size and function.No obvious abnormalities of the aortic and mitral valves.Unable to evaluate the right atrium, atrial septum or right heartvalves.No pericardial effusion.  Carotid Doppler   Findings suggest 1-39% internal carotid artery stenosis bilaterally. The left vertebral artery is patent with antegrade flow. Unable to visualize the right vertebral artery.   PHYSICAL EXAM  Temp:  [97.7 F (36.5 C)-98.4 F (36.9 C)] 98.3 F (36.8 C) (12/27 0117) Pulse Rate:  [58-71] 60 (12/27 0117) Resp:  [16-20] 20 (12/27 0117) BP: (119-151)/(53-73) 133/60 mmHg (12/27 0117) SpO2:  [93 %-100 %] 94 % (12/27 0117)  General - Well nourished, well developed, in no apparent distress.  Ophthalmologic - Sharp disc margins OU.  Cardiovascular - Regular rate and rhythm with no murmur.  Mental Status -  Level of arousal and orientation to time, place, and person were intact. Language including expression, naming, repetition, comprehension was assessed and found intact. Fund of Knowledge was assessed and was intact.  Cranial Nerves II - XII - II - Visual field intact OU. III, IV, VI - Extraocular movements intact. V - Facial sensation intact bilaterally. VII - left nasolabial fold flattening. VIII - Hearing & vestibular intact bilaterally. X - Palate elevates symmetrically, mild slurry speech. XI - Chin turning & shoulder shrug intact bilaterally. XII - Tongue protrusion intact.  Motor Strength - The patient's strength was LUE 3/5, LLE 4/5 proximal but 5/5 distal and pronator drift was present on the left, right UE and LE 5/5.  Bulk was normal and fasciculations were absent.   Motor Tone - Muscle tone was assessed at the neck and appendages and was normal.  Reflexes - The patient's reflexes were 1+ in all extremities and he had no pathological reflexes.  Sensory - Light touch, temperature/pinprick were assessed and were symmetrical.    Coordination - The patient had normal movements in the  hands and feet with no ataxia or dysmetria.  Tremor was absent.  Gait and Station - not tested due to weakness and safety concerns.  ASSESSMENT/PLAN Tyler Pierce is a 69 y.o. male with no reported past medical history presenting with left sided weakness. He did not receive IV t-PA due to outside tPA window.   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 small vessel disease   MRA  medium and small vessel disease   Carotid Doppler  No significant stenosis   2D Echo  No source of embolus   LDL 73  HgbA1c pending  Lovenox 40 mg sq daily for VTE prophylaxis DIET DYS 2 Room service appropriate?: Yes; Fluid consistency:: Honey Thick  No antithrombotic prior to admission, now  on ASA '325mg'$ . Continue ASA at discharge.  Patient counseled to be compliant with his antithrombotic medications  Ongoing aggressive stroke risk factor management  Recommend 30 day tele monitor if/when pt insurance can afford due to larger size of stroke than typical lacunar stroke.  Therapy recommendations:  CIR  Disposition:  pending   Hypertension  Stable Permissive hypertension (OK if < 220/120) but gradually normalize in 5-7 days  Hyperlipidemia  Home meds:  No statin  LDL 73, goal < 70  Now on lipitor 10 mg daily  Continue statin at discharge  Diabetes  HgbA1c pending, goal < 7.0  Tobacco abuse  Current smoker  Smoking cessation counseling provided  Pt is willing to quit  Other Stroke Risk Factors  Advanced age  ETOH use  Other Active Problems  hypokalemia  Hospital day # 2  Neurology will sign off. Please call with questions. Pt will follow up with Dr. Erlinda Hong at [facility] in about 2 months. Thanks for  the consult.  Rosalin Hawking, MD PhD Stroke Neurology 11/08/2015 3:21 AM   To contact Stroke Continuity provider, please refer to [email or web address] After hours, contact General Neurology

## 2015-11-07 NOTE — Progress Notes (Addendum)
Patient Demographics  Tyler Pierce, is a 69 y.o. male, DOB - Jan 06, 1946, ZTI:[id number]  Admit date - 11/05/2015   Admitting Physician Theressa Millard, MD  Outpatient Primary MD for the patient is No PCP Per Patient  LOS - 1   Chief Complaint  Patient presents with  . Chest Pain       Admission HPI/Brief narrative: 69 y.o. male with no r past medical history presents  with left sided weakness, MRI brain significant for acute nonhemorrhagic infarct in right basal ganglia. Subjective:   [place] today has, No headache, No chest pain, No abdominal pain - reports left-sided weakness feels the same, no further cough. Assessment & Plan    Principal Problem:   CVA (cerebral infarction) Active Problems:   Left-sided weakness   Fall   Hypokalemia   Thrombocytopenia (HCC)   Alcohol abuse  Acute right basal ganglia CVA  - With residual left-sided weakness, dysphagia  - MRI Acute nonhemorrhagic infarct of the right basal ganglia measures up to 2.7 cm. with multiple remote infarcts. - MRA - Diffuse distal medium and small vessel disease. - Carotid Doppler:1-39% internal carotid artery stenosis bilaterally. The left vertebral artery is patent with antegrade flow. Unable to visualize the right vertebral artery. - 2-D echo with no evidence of embolic source identified  - No antithrombotic prior to admission, currently on aspirin 325 mg oral daily - LDL is 73, will start on Lipitor 10 mg. - Hemoglobin A1c pending - Seen by SLP, initially nothing by mouth, had modified barium study done today, diet advanced to dysphagia 2 with nectar thick. - Awaiting CIR evaluation - plan D/W neurology Dr Erlinda Hong.  Hypokalemia - Repleted  Tobacco abuse - Patient was counseled  - Patient denies alcohol abuse  Code Status: Full  Family Communication: none at bedside  Disposition Plan: awaiting CIR  evaluation   Procedures none   Consults  neurology   Medications  Scheduled Meds: . [START ON 11/08/2015] aspirin  325 mg Oral Daily  . atorvastatin  10 mg Oral q1800  . enoxaparin (LOVENOX) injection  40 mg Subcutaneous Daily   Continuous Infusions: . 0.9 % NaCl with KCl 20 mEq / L 75 mL/hr at 11/07/15 0505   PRN Meds:.guaiFENesin-dextromethorphan, senna-docusate  DVT Prophylaxis  Lovenox -  Lab Results  Component Value Date   PLT 122* 11/06/2015    Antibiotics   Anti-infectives    None          Objective:   Filed Vitals:   11/07/15 0152 11/07/15 0552 11/07/15 1017 11/07/15 1350  BP: 124/51 131/53 122/64   Pulse: 66 58 68 63  Temp: 99.1 F (37.3 C) 98.4 F (36.9 C) 98.2 F (36.8 C) 97.8 F (36.6 C)  TempSrc: Oral Axillary Oral Oral  Resp: '18 16 20 20  '$ Height:      Weight:      SpO2: 96% 100% 99% 93%    Wt Readings from Last 3 Encounters:  11/06/15 69.718 kg (153 lb 11.2 oz)     Intake/Output Summary (Last 24 hours) at 11/07/15 1408 Last data filed at 11/07/15 1021  Gross per 24 hour  Intake      0 ml  Output    640 ml  Net   -640 ml     Physical Exam  Awake Alert,with mild aphasia Supple Neck,No JVD,  Symmetrical Chest wall movement, Good air movement bilaterally, no use of accessory muscles  RRR,No Gallops,Rubs or new Murmurs, No Parasternal Heave +ve B.Sounds, Abd Soft, No tenderness, No organomegaly appriciated, No rebound - guarding or rigidity. No Cyanosis, Clubbing or edema, significant left side weakness upper more than lower extremities   Data Review   Micro Results No results found for this or any previous visit (from the past 240 hour(s)).  Radiology Reports Dg Chest 2 View  11/06/2015  CLINICAL DATA:  Acute onset of confusion. Recent fall, with concern for chest injury. Cough. Initial encounter. EXAM: CHEST  2 VIEW COMPARISON:  Chest radiograph performed 10/19/2015 FINDINGS: The lungs are well-aerated and clear.  Density overlying the left lung base is thought to reflect a lead. There is no evidence of focal opacification, pleural effusion or pneumothorax. The heart is normal in size; the mediastinal contour is within normal limits. No acute osseous abnormalities are seen. IMPRESSION: No acute cardiopulmonary process seen. No displaced rib fractures identified. Electronically Signed   By: Garald Balding M.D.   On: 11/06/2015 01:26   Dg Chest 2 View  10/19/2015  CLINICAL DATA:  Cough and chest congestion. Shortness breath. Chest pain. Weakness. COPD. EXAM: CHEST  2 VIEW COMPARISON:  10/17/2006 FINDINGS: The heart size and mediastinal contours are within normal limits. Both lungs are clear. Pulmonary hyperinflation again seen, consistent with COPD. No evidence of pneumothorax or pleural effusion. The visualized skeletal structures are unremarkable. IMPRESSION: COPD.  No active cardiopulmonary disease. Electronically Signed   By: Earle Gell M.D.   On: 10/19/2015 17:11   Dg Elbow Complete Left  11/06/2015  CLINICAL DATA:  Acute onset of confusion. Recent fall, with laceration at the posterior left elbow. Initial encounter. EXAM: LEFT ELBOW - COMPLETE 3+ VIEW COMPARISON:  Left elbow radiographs performed 08/18/2004 FINDINGS: There is no evidence of fracture or dislocation. The visualized plate and screws along the olecranon is grossly intact, without evidence of loosening. The visualized joint spaces are preserved. No significant joint effusion is identified. The soft tissues are unremarkable in appearance. IMPRESSION: No evidence of fracture or dislocation. Hardware appears grossly intact. Electronically Signed   By: Garald Balding M.D.   On: 11/06/2015 01:23   Ct Head Wo Contrast  11/06/2015  CLINICAL DATA:  Acute onset of left upper and lower extremity weakness and numbness. Status post fall. Left facial numbness. Concern for cervical spine injury. Initial encounter. EXAM: CT HEAD WITHOUT CONTRAST CT CERVICAL SPINE  WITHOUT CONTRAST TECHNIQUE: Multidetector CT imaging of the head and cervical spine was performed following the standard protocol without intravenous contrast. Multiplanar CT image reconstructions of the cervical spine were also generated. COMPARISON:  CT of the head and cervical spine performed 10/19/2015 FINDINGS: CT HEAD FINDINGS There is no evidence of acute infarction, mass lesion, or intra- or extra-axial hemorrhage on CT. Prominence of the ventricles and sulci suggests mild cortical volume loss. Mild cerebellar atrophy is noted. Scattered periventricular and subcortical white matter change likely reflects small vessel ischemic microangiopathy. Chronic lacunar infarcts are seen at the basal ganglia bilaterally. The brainstem and fourth ventricle are within normal limits. The cerebral hemispheres demonstrate grossly normal gray-white differentiation. No mass effect or midline shift is seen. There is no evidence of fracture; visualized osseous structures are unremarkable in appearance. The orbits are within normal limits. Mucosal thickening is noted at the maxillary sinuses  bilaterally. The remaining paranasal sinuses and mastoid air cells are well-aerated. No significant soft tissue abnormalities are seen. CT CERVICAL SPINE FINDINGS There is no evidence of fracture or subluxation. Vertebral bodies demonstrate normal height and alignment. Minimal multilevel disc space narrowing is noted along the cervical spine, with scattered endplate subcortical cystic change. Scattered small anterior and posterior disc osteophyte complexes are noted along the cervical spine. Prevertebral soft tissues are within normal limits. The thyroid gland is unremarkable in appearance. Emphysematous change is noted at the lung apices, with minimal associated scarring. No significant soft tissue abnormalities are seen. IMPRESSION: 1. No evidence of traumatic intracranial injury or fracture. 2. No evidence of fracture or subluxation along  the cervical spine. 3. Mild cortical volume loss and scattered small vessel ischemic microangiopathy. Chronic lacunar infarcts at the basal ganglia bilaterally. 4. Minimal degenerative change along the cervical spine. 5. Emphysematous change at the lung apices, with minimal associated scarring. Electronically Signed   By: Garald Balding M.D.   On: 11/06/2015 01:34   Ct Head Wo Contrast  10/19/2015  CLINICAL DATA:  Dizziness with fall.  Pain after fall EXAM: CT HEAD WITHOUT CONTRAST CT CERVICAL SPINE WITHOUT CONTRAST TECHNIQUE: Multidetector CT imaging of the head and cervical spine was performed following the standard protocol without intravenous contrast. Multiplanar CT image reconstructions of the cervical spine were also generated. COMPARISON:  None. FINDINGS: CT HEAD FINDINGS There is mild diffuse atrophy with slightly greater parietal lobe atrophy than elsewhere. There is no intracranial mass, hemorrhage, extra-axial fluid collection, or midline shift. There is small vessel disease throughout the centra semiovale bilaterally. There is evidence of a prior focal lacunar infarct in the inferior mid left centrum semiovale. There is evidence of an age uncertain small infarct in the anterior limb of the left external capsule. The bony calvarium appears intact. The mastoid air cells are clear. There is mucosal thickening in both maxillary antra as well as in multiple ethmoid air cells, the inferior frontal sinuses bilaterally, and the right sphenoid sinus. No intraorbital lesions are identified. CT CERVICAL SPINE FINDINGS There is no fracture or spondylolisthesis. Prevertebral soft tissues and predental space regions are normal. There is moderate disc space narrowing at C3-4, C4-5, C5-6, and C6-7. There are anterior osteophytes at C3, C4, C5, and C6. There is facet hypertrophy at multiple levels. Exit foraminal narrowing is noted at C3-4, C4-5, C5-6, and C6-7 bilaterally. No disc extrusion or high-grade stenosis.  There are scattered foci of coronary artery calcification bilaterally. There is bullous disease in each lung apex. IMPRESSION: CT head: Atrophy with extensive periventricular small vessel disease. 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 No hemorrhage or mass effect. Multifocal paranasal sinus disease. CT cervical spine: No fracture or spondylolisthesis. Multilevel osteoarthritic change. Scattered calcification in the carotid arteries bilaterally. Bullous disease in each lung apex. Electronically Signed   By: Lowella Grip III M.D.   On: 10/19/2015 19:52   Ct Cervical Spine Wo Contrast  11/06/2015  CLINICAL DATA:  Acute onset of left upper and lower extremity weakness and numbness. Status post fall. Left facial numbness. Concern for cervical spine injury. Initial encounter. EXAM: CT HEAD WITHOUT CONTRAST CT CERVICAL SPINE WITHOUT CONTRAST TECHNIQUE: Multidetector CT imaging of the head and cervical spine was performed following the standard protocol without intravenous contrast. Multiplanar CT image reconstructions of the cervical spine were also generated. COMPARISON:  CT of the head and cervical spine performed 10/19/2015 FINDINGS: CT HEAD FINDINGS There is  no evidence of acute infarction, mass lesion, or intra- or extra-axial hemorrhage on CT. Prominence of the ventricles and sulci suggests mild cortical volume loss. Mild cerebellar atrophy is noted. Scattered periventricular and subcortical white matter change likely reflects small vessel ischemic microangiopathy. Chronic lacunar infarcts are seen at the basal ganglia bilaterally. The brainstem and fourth ventricle are within normal limits. The cerebral hemispheres demonstrate grossly normal gray-white differentiation. No mass effect or midline shift is seen. There is no evidence of fracture; visualized osseous structures are unremarkable in appearance. The orbits are within normal limits. Mucosal thickening is noted  at the maxillary sinuses bilaterally. The remaining paranasal sinuses and mastoid air cells are well-aerated. No significant soft tissue abnormalities are seen. CT CERVICAL SPINE FINDINGS There is no evidence of fracture or subluxation. Vertebral bodies demonstrate normal height and alignment. Minimal multilevel disc space narrowing is noted along the cervical spine, with scattered endplate subcortical cystic change. Scattered small anterior and posterior disc osteophyte complexes are noted along the cervical spine. Prevertebral soft tissues are within normal limits. The thyroid gland is unremarkable in appearance. Emphysematous change is noted at the lung apices, with minimal associated scarring. No significant soft tissue abnormalities are seen. IMPRESSION: 1. No evidence of traumatic intracranial injury or fracture. 2. No evidence of fracture or subluxation along the cervical spine. 3. Mild cortical volume loss and scattered small vessel ischemic microangiopathy. Chronic lacunar infarcts at the basal ganglia bilaterally. 4. Minimal degenerative change along the cervical spine. 5. Emphysematous change at the lung apices, with minimal associated scarring. Electronically Signed   By: Garald Balding M.D.   On: 11/06/2015 01:34   Ct Cervical Spine Wo Contrast  10/19/2015  CLINICAL DATA:  Dizziness with fall.  Pain after fall EXAM: CT HEAD WITHOUT CONTRAST CT CERVICAL SPINE WITHOUT CONTRAST TECHNIQUE: Multidetector CT imaging of the head and cervical spine was performed following the standard protocol without intravenous contrast. Multiplanar CT image reconstructions of the cervical spine were also generated. COMPARISON:  None. FINDINGS: CT HEAD FINDINGS There is mild diffuse atrophy with slightly greater parietal lobe atrophy than elsewhere. There is no intracranial mass, hemorrhage, extra-axial fluid collection, or midline shift. There is small vessel disease throughout the centra semiovale bilaterally. There is  evidence of a prior focal lacunar infarct in the inferior mid left centrum semiovale. There is evidence of an age uncertain small infarct in the anterior limb of the left external capsule. The bony calvarium appears intact. The mastoid air cells are clear. There is mucosal thickening in both maxillary antra as well as in multiple ethmoid air cells, the inferior frontal sinuses bilaterally, and the right sphenoid sinus. No intraorbital lesions are identified. CT CERVICAL SPINE FINDINGS There is no fracture or spondylolisthesis. Prevertebral soft tissues and predental space regions are normal. There is moderate disc space narrowing at C3-4, C4-5, C5-6, and C6-7. There are anterior osteophytes at C3, C4, C5, and C6. There is facet hypertrophy at multiple levels. Exit foraminal narrowing is noted at C3-4, C4-5, C5-6, and C6-7 bilaterally. No disc extrusion or high-grade stenosis. There are scattered foci of coronary artery calcification bilaterally. There is bullous disease in each lung apex. IMPRESSION: CT head: Atrophy with extensive periventricular small vessel disease. 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 No hemorrhage or mass effect. Multifocal paranasal sinus disease. CT cervical spine: No fracture or spondylolisthesis. Multilevel osteoarthritic change. Scattered calcification in the carotid arteries bilaterally. Bullous disease in each lung apex. Electronically Signed  By: Lowella Grip III M.D.   On: 10/19/2015 19:52   Mr Brain Wo Contrast  11/06/2015  CLINICAL DATA:  Sudden onset of left-sided weakness involving the face, upper and lower extremity beginning over 24 hours ago. The patient fell due to weakness. EXAM: MRI HEAD WITHOUT CONTRAST MRA HEAD WITHOUT CONTRAST TECHNIQUE: Multiplanar, multiecho pulse sequences of the brain and surrounding structures were obtained without intravenous contrast. Angiographic images of the head were obtained using MRA  technique without contrast. COMPARISON:  CT head without contrast 10/19/2015. FINDINGS: MRI HEAD FINDINGS An acute nonhemorrhagic infarct within the right basal ganglia measures 2.7 cm maximally. There is associated T2 signal change in edema with this infarct. A remote infarct in the left basal ganglia measures 10 mm. There is no acute hemorrhage or mass lesion. Moderate to severe diffuse periventricular and subcortical white matter changes are evident bilaterally. Additional smaller remote lacunar infarcts are present within the basal ganglia bilaterally. The ventricles are proportionate to the degree of atrophy. No significant extra-axial fluid collection is present. The brainstem and cerebellum are unremarkable. The right vertebral artery is hypoplastic. Flow is present in the major intracranial arteries. The globes and orbits are intact. There is near complete opacification of the left maxillary sinus. Prominent mucosal thickening is noted along the floor of the right maxillary sinus with a small fluid level. The paranasal sinuses and mastoid air cells are otherwise clear. The skullbase is within normal limits. Midline sagittal images are unremarkable MRA HEAD FINDINGS The internal carotid arteries are within normal limits from the high cervical segments through the ICA termini bilaterally. The A1 and M1 segments are intact. The anterior communicating artery is patent. The MCA bifurcations are intact. Moderate medium and small vessel attenuation is symmetric. The left vertebral artery is the dominant vessel. The right vertebral artery is hypoplastic above the dural margin. The basilar artery is intact. Both posterior cerebral arteries originate from the basilar tip. There is moderate attenuation of distal PCA branch vessels bilaterally. IMPRESSION: 1. 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 2. Remote 10 mm nonhemorrhagic infarct of the left basal ganglia in a similar  location. 3. Additional smaller remote lacunar infarcts are present in the basal ganglia bilaterally. 4. Advanced diffuse confluent periventricular and subcortical white matter disease bilaterally compatible with severe microvascular ischemia. 5. The MRA demonstrates diffuse distal medium and small vessel disease without significant proximal stenosis, aneurysm, or branch vessel occlusion. These results will be called to the ordering clinician or representative by the Radiologist Assistant, and communication documented in the PACS or zVision Dashboard. Electronically Signed   By: San Morelle M.D.   On: 11/06/2015 08:30   Dg Knee Complete 4 Views Left  11/06/2015  CLINICAL DATA:  Acute onset of confusion. Status post fall, with anterior left knee laceration. Initial encounter. EXAM: LEFT KNEE - COMPLETE 4+ VIEW COMPARISON:  None. FINDINGS: There is no evidence of fracture or dislocation. The joint spaces are preserved. No significant degenerative change is seen; the patellofemoral joint is grossly unremarkable in appearance. A small knee joint effusion is suggested. The visualized soft tissues are otherwise unremarkable in appearance. IMPRESSION: 1. No evidence of fracture or dislocation. 2. Small knee joint effusion suggested. Electronically Signed   By: Garald Balding M.D.   On: 11/06/2015 01:25   Dg Swallowing Func-speech Pathology  11/07/2015  Objective Swallowing Evaluation:   MBS Patient Details Name: YAHYA BOLDMAN MRN: [id number] Date of Birth: 1945-11-20 Today's Date:  11/07/2015 Time: SLP Start Time (ACUTE ONLY): 1330-SLP Stop Time (ACUTE ONLY): 1346 SLP Time Calculation (min) (ACUTE ONLY): 16 min Past Medical History: No past medical history on file. Past Surgical History: Past Surgical History Procedure Laterality Date . Finger amputation   HPI: NISAIAH BECHTOL is an 69 y.o. male with no reported past medical history presenting with sudden onset of left sided weakness. Out of tPA window. MRI:  acute nonhemorrhagic infarct within the right basal ganglia No Data Recorded Assessment / Plan / Recommendation CHL IP CLINICAL IMPRESSIONS 11/07/2015 Therapy Diagnosis Moderate oral phase dysphagia;Moderate pharyngeal phase dysphagia Clinical Impression Patient presents with a moderate oropharyngeal dysphagia characterized by CN VII and XII involvement impacting left sided facial/lingual strength and sensory impairments. Combination of above results in anterior labial loss of bolus, delayed oral transit of bolus, and delayed swallow initiation with deep penetration of liquids thinner than honey thick. Penetration sensed only when reaches the level of the vocal cords and cough response inconsistent to clear. Cues for chin tuck ineffective to increase airway protection. Recommend initiation of conservative diet with close SLP f/u to monitor for diet tolerance and potential to advance.   Impact on safety and function Moderate aspiration risk   CHL IP TREATMENT RECOMMENDATION 11/07/2015 Treatment Recommendations Therapy as outlined in treatment plan below   Prognosis 11/07/2015 Prognosis for Safe Diet Advancement Good Barriers to Reach Goals -- Barriers/Prognosis Comment -- CHL IP DIET RECOMMENDATION 11/07/2015 SLP Diet Recommendations Dysphagia 2 (Fine chop) solids;Honey thick liquids Liquid Administration via Spoon Medication Administration Crushed with puree Compensations Slow rate;Small sips/bites Postural Changes Seated upright at 90 degrees   CHL IP OTHER RECOMMENDATIONS 11/07/2015 Recommended Consults -- Oral Care Recommendations Oral care BID Other Recommendations Order thickener from pharmacy;Prohibited food (jello, ice cream, thin soups);Remove water pitcher   CHL IP FOLLOW UP RECOMMENDATIONS 11/07/2015 Follow up Recommendations Inpatient Rehab   CHL IP FREQUENCY AND DURATION 11/07/2015 Speech Therapy Frequency (ACUTE ONLY) min 2x/week Treatment Duration 2 weeks      CHL IP ORAL PHASE 11/07/2015 Oral Phase  Impaired Oral - Pudding Teaspoon -- Oral - Pudding Cup -- Oral - Honey Teaspoon Left anterior bolus loss;Lingual/palatal residue;Delayed oral transit Oral - Honey Cup -- Oral - Nectar Teaspoon Left anterior bolus loss;Lingual/palatal residue;Delayed oral transit Oral - Nectar Cup Left anterior bolus loss;Lingual/palatal residue;Delayed oral transit Oral - Nectar Straw -- Oral - Thin Teaspoon -- Oral - Thin Cup Left anterior bolus loss;Lingual/palatal residue;Delayed oral transit Oral - Thin Straw -- Oral - Puree Lingual/palatal residue;Delayed oral transit Oral - Mech Soft Lingual/palatal residue;Delayed oral transit Oral - Regular -- Oral - Multi-Consistency -- Oral - Pill -- Oral Phase - Comment --  CHL IP PHARYNGEAL PHASE 11/07/2015 Pharyngeal Phase Impaired Pharyngeal- Pudding Teaspoon -- Pharyngeal -- Pharyngeal- Pudding Cup -- Pharyngeal -- Pharyngeal- Honey Teaspoon Delayed swallow initiation-pyriform sinuses Pharyngeal -- Pharyngeal- Honey Cup -- Pharyngeal -- Pharyngeal- Nectar Teaspoon Delayed swallow initiation-pyriform sinuses;Penetration/Aspiration before swallow;Penetration/Aspiration during swallow Pharyngeal Material enters airway, CONTACTS cords and not ejected out Pharyngeal- Nectar Cup Delayed swallow initiation-pyriform sinuses;Penetration/Aspiration before swallow;Penetration/Aspiration during swallow Pharyngeal Material enters airway, CONTACTS cords and not ejected out Pharyngeal- Nectar Straw -- Pharyngeal -- Pharyngeal- Thin Teaspoon -- Pharyngeal -- Pharyngeal- Thin Cup Delayed swallow initiation-pyriform sinuses;Penetration/Aspiration before swallow;Penetration/Aspiration during swallow Pharyngeal Material enters airway, CONTACTS cords and not ejected out Pharyngeal- Thin Straw -- Pharyngeal -- Pharyngeal- Puree Delayed swallow initiation-vallecula Pharyngeal -- Pharyngeal- Mechanical Soft Delayed swallow initiation-vallecula Pharyngeal -- Pharyngeal- Regular -- Pharyngeal -- Pharyngeal-  Multi-consistency --  Pharyngeal -- Pharyngeal- Pill -- Pharyngeal -- Pharyngeal Comment --  CHL IP CERVICAL ESOPHAGEAL PHASE 11/07/2015 Cervical Esophageal Phase WFL Pudding Teaspoon -- Pudding Cup -- Honey Teaspoon -- Honey Cup -- Nectar Teaspoon -- Nectar Cup -- Nectar Straw -- Thin Teaspoon -- Thin Cup -- Thin Straw -- Puree -- Mechanical Soft -- Regular -- Multi-consistency -- Pill -- Cervical Esophageal Comment -- Gabriel Rainwater MA, CCC-SLP [phone] Gabriel Rainwater [name] 11/07/2015, 1:58 PM              Mr Jodene Nam Head/brain Wo Cm  11/06/2015  CLINICAL DATA:  Sudden onset of left-sided weakness involving the face, upper and lower extremity beginning over 24 hours ago. The patient fell due to weakness. EXAM: MRI HEAD WITHOUT CONTRAST MRA HEAD WITHOUT CONTRAST TECHNIQUE: Multiplanar, multiecho pulse sequences of the brain and surrounding structures were obtained without intravenous contrast. Angiographic images of the head were obtained using MRA technique without contrast. COMPARISON:  CT head without contrast 10/19/2015. FINDINGS: MRI HEAD FINDINGS An acute nonhemorrhagic infarct within the right basal ganglia measures 2.7 cm maximally. There is associated T2 signal change in edema with this infarct. A remote infarct in the left basal ganglia measures 10 mm. There is no acute hemorrhage or mass lesion. Moderate to severe diffuse periventricular and subcortical white matter changes are evident bilaterally. Additional smaller remote lacunar infarcts are present within the basal ganglia bilaterally. The ventricles are proportionate to the degree of atrophy. No significant extra-axial fluid collection is present. The brainstem and cerebellum are unremarkable. The right vertebral artery is hypoplastic. Flow is present in the major intracranial arteries. The globes and orbits are intact. There is near complete opacification of the left maxillary sinus. Prominent mucosal thickening is noted along the floor of the right  maxillary sinus with a small fluid level. The paranasal sinuses and mastoid air cells are otherwise clear. The skullbase is within normal limits. Midline sagittal images are unremarkable MRA HEAD FINDINGS The internal carotid arteries are within normal limits from the high cervical segments through the ICA termini bilaterally. The A1 and M1 segments are intact. The anterior communicating artery is patent. The MCA bifurcations are intact. Moderate medium and small vessel attenuation is symmetric. The left vertebral artery is the dominant vessel. The right vertebral artery is hypoplastic above the dural margin. The basilar artery is intact. Both posterior cerebral arteries originate from the basilar tip. There is moderate attenuation of distal PCA branch vessels bilaterally. IMPRESSION: 1. 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 2. Remote 10 mm nonhemorrhagic infarct of the left basal ganglia in a similar location. 3. Additional smaller remote lacunar infarcts are present in the basal ganglia bilaterally. 4. Advanced diffuse confluent periventricular and subcortical white matter disease bilaterally compatible with severe microvascular ischemia. 5. The MRA demonstrates diffuse distal medium and small vessel disease without significant proximal stenosis, aneurysm, or branch vessel occlusion. These results will be called to the ordering clinician or representative by the Radiologist Assistant, and communication documented in the PACS or zVision Dashboard. Electronically Signed   By: San Morelle M.D.   On: 11/06/2015 08:30     CBC  Recent Labs Lab 11/06/15 0028 11/06/15 0036  WBC 6.4  --   HGB 12.9* 13.9  HCT 36.6* 41.0  PLT 122*  --   MCV 108.9*  --   MCH 38.4*  --   MCHC 35.2  --   RDW 12.7  --   LYMPHSABS 1.9  --  MONOABS 0.5  --   EOSABS 0.1  --   BASOSABS 0.0  --     Chemistries   Recent Labs Lab 11/06/15 0028 11/06/15 0036 11/07/15 0610    NA 135 136 138  K 3.4* 3.2* 3.7  CL 100* 97* 106  CO2 25  --  23  GLUCOSE 96 92 71  BUN '10 11 11  '$ CREATININE 0.99 0.90 0.96  CALCIUM 8.5*  --  8.1*  MG  --   --  1.9  AST 29  --   --   ALT 25  --   --   ALKPHOS 54  --   --   BILITOT 2.1*  --   --    ------------------------------------------------------------------------------------------------------------------ estimated creatinine clearance is 72.6 mL/min (by C-G formula based on Cr of 0.96). ------------------------------------------------------------------------------------------------------------------ No results for input(s): HGBA1C in the last 72 hours. ------------------------------------------------------------------------------------------------------------------  Recent Labs  11/06/15 0433  CHOL 124  HDL 37*  LDLCALC 73  TRIG 69  CHOLHDL 3.4   ------------------------------------------------------------------------------------------------------------------ No results for input(s): TSH, T4TOTAL, T3FREE, THYROIDAB in the last 72 hours.  Invalid input(s): FREET3 ------------------------------------------------------------------------------------------------------------------ No results for input(s): VITAMINB12, FOLATE, FERRITIN, TIBC, IRON, RETICCTPCT in the last 72 hours.  Coagulation profile  Recent Labs Lab 11/06/15 0028  INR 1.17    No results for input(s): DDIMER in the last 72 hours.  Cardiac Enzymes No results for input(s): CKMB, TROPONINI, MYOGLOBIN in the last 168 hours.  Invalid input(s): CK ------------------------------------------------------------------------------------------------------------------ Invalid input(s): POCBNP     Time Spent in minutes   25 minutes   Sunita Demond M.D on 11/07/2015 at 2:08 PM  Between 7am to 7pm - Pager - [phone]  After 7pm go to [email or web address] - password [facility]  [facility]   Office  [phone]

## 2015-11-07 NOTE — Progress Notes (Signed)
Initial Nutrition Assessment  DOCUMENTATION CODES:   Not applicable  INTERVENTION:   -RD will follow for diet advancement and supplement diet as appropriate -If pt unable to take po's, recommend initiation of nutrition support: Initiate Jevity 1.2 @ 20 ml/hr via NGT and increase by 10 ml every 4 hours to goal rate of 60 ml/hr.   Tube feeding regimen provides 1728 kcal (100% of needs), 80 grams of protein, and 1162 ml of H2O.   If no IVFS, recommend 135 ml free water flush 4 times daily (to provide additional 540 ml free water (1702 ml total water with TF regimen).   NUTRITION DIAGNOSIS:   Inadequate oral intake related to inability to eat as evidenced by NPO status.  GOAL:   Patient will meet greater than or equal to 90% of their needs  MONITOR:   Diet advancement, Labs, Weight trends, Skin, I & O's  REASON FOR ASSESSMENT:   Malnutrition Screening Tool    ASSESSMENT:   Tyler Pierce is a 69 y.o. male who was brought to the ED he had been found on the ground in a shed. He reports that he had sudden onset of left sided weakness and fell onto the ground. He could not get back up due to the weakness. His last know well was a 0500 AM on 11/05/2015. He denies having any preceding headache or dizziness. He denies having chest pain. He has a history of heavy drinking, and reports that his last drink was 2 weeks ago because he had GI virus. He was evlauted in the ED and a CT scan of the heaad was negative for acute findings. He was seen by Neurology and referred for admission.   Pt admitted with acute rt basal ganglia CVA.   Hx provided by pt, who was sitting in recliner at time of visit. He was able to provide simple history, however, struggled to provide further details when probed. He reports poor appetite PTA, reporting this was going on "for a long time". However, he denies weight loss, revealing UBW of around 150#. Noted that pt is homeless, so social situation  may be contributing to poor appetite PTA.   SLP performed BSE on 11/06/15; recommendation was to continue NPO due to moderate aspiration risk. SLP plan to re-evaluate today. Pt continues to complain of difficulty swallowing and is understanding of current NPO status.   Nutrition-Focused physical exam completed. Findings are no fat depletion, mild muscle depletion, and no edema. Pt endorses decline in mobility PTA.   Therapy following; recommending CIR evaluation.   Labs reviewed.   Diet Order:  Diet NPO time specified  Skin:  Reviewed, no issues  Last BM:  11/04/15  Height:   Ht Readings from Last 1 Encounters:  11/06/15 6' (1.829 m)    Weight:   Wt Readings from Last 1 Encounters:  11/06/15 153 lb 11.2 oz (69.718 kg)    Ideal Body Weight:  80.9 kg  BMI:  Body mass index is 20.84 kg/(m^2).  Estimated Nutritional Needs:   Kcal:  1700-1900  Protein:  75-90 grams  Fluid:  1.7-1.9 L  EDUCATION NEEDS:   Education needs addressed  Valda Christenson A. Jimmye Norman, RD, LDN, CDE Pager: [phone] After hours Pager: [phone]

## 2015-11-07 NOTE — Progress Notes (Signed)
Rehab Admissions Coordinator Note:  Patient was screened by Retta Diones for appropriateness for an Inpatient Acute Rehab Consult.  At this time, we are recommending Inpatient Rehab consult.  Retta Diones 11/07/2015, 10:26 AM  I can be reached at [phone].

## 2015-11-08 DIAGNOSIS — E785 Hyperlipidemia, unspecified: Secondary | ICD-10-CM | POA: Insufficient documentation

## 2015-11-08 LAB — HEMOGLOBIN A1C
HEMOGLOBIN A1C: 5.5 % (ref 4.8–5.6)
MEAN PLASMA GLUCOSE: 111 mg/dL

## 2015-11-08 MED ORDER — DOCUSATE SODIUM 100 MG PO CAPS
100.0000 mg | ORAL_CAPSULE | Freq: Two times a day (BID) | ORAL | Status: DC
  Administered 2015-11-08 – 2015-11-09 (×2): 100 mg via ORAL
  Filled 2015-11-08 (×2): qty 1

## 2015-11-08 MED ORDER — SENNA 8.6 MG PO TABS
1.0000 | ORAL_TABLET | Freq: Two times a day (BID) | ORAL | Status: DC
  Administered 2015-11-08 – 2015-11-09 (×2): 8.6 mg via ORAL
  Filled 2015-11-08 (×2): qty 1

## 2015-11-08 MED ORDER — OLOPATADINE HCL 0.1 % OP SOLN
1.0000 [drp] | Freq: Two times a day (BID) | OPHTHALMIC | Status: DC
  Administered 2015-11-08 – 2015-11-09 (×2): 1 [drp] via OPHTHALMIC
  Filled 2015-11-08: qty 5

## 2015-11-08 NOTE — Evaluation (Signed)
Speech Language Pathology Evaluation Patient Details Name: Tyler Pierce MRN: [id number] DOB: Dec 23, 1945 Today's Date: 11/08/2015 Time: 9983-3825 SLP Time Calculation (min) (ACUTE ONLY): 40 min  Problem List:  Patient Active Problem List   Diagnosis Date Noted  . HLD (hyperlipidemia)   . Tobacco abuse   . Hemiparesis affecting left side as late effect of stroke ([place])   . 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 Dysphagia as late effect of cerebrovascular disease   . Left-sided weakness 11/06/2015  . CVA (cerebral infarction) 11/06/2015  . Fall 11/06/2015  . Hypokalemia 11/06/2015  . Thrombocytopenia ([place]) 11/06/2015  . Alcohol abuse 11/06/2015   Past Medical History: History reviewed. No pertinent past medical history. Past Surgical History:  Past Surgical History  Procedure Laterality Date  . Finger amputation     HPI:  69 y.o. male who was brought to the ED he had been found on the ground in a shed on 11/06/15. He reports that he had sudden onset of left sided weakness and fell onto the ground. He could not get back up due to the weakness. His last know well was a 0500 AM on 11/05/2015. He denies having any preceding headache or dizziness. He denies having chest pain. He has a history of heavy drinking, and reports that his last drink was 2 weeks ago because he had GI virus. Pt had a MBS on 11/07/15 indicating a need for honey-thickened liquids and Dysphagia 2 diet with swallowing precautions.  Assessment / Plan / Recommendation Clinical Impression   Pt exhibits flaccid dysarthria characterized by low vocal intensity and 50-75% intelligible speech in words-conversational tasks. Verbal expression was compromised d/t decreased speech intelligibility. He was able to follow multi-step directives, name items in confrontational tasks, divergent/convergent tasks and repeat words-simple phrases without difficulty.  Pt exhibits some visual deficits on left  which impact his reading with complex information, but functional tasks (i.e.: menu, signs within room) were [facility].  Graphic expression was not assessed.  Pt's cognitive skills appear WFL as well.  He was also assessed for diet tolerance with honey-thickened liquids only today with intermittent throat clearing and repetitive swallows needed via verbal cueing during intake of tsp amounts of honey-thickened liquids; intake of Dysphagia 2 consistencies were not assessed d/t pt refusal, but nursing indicated he did cough intermittently with Dysphagia 2 consistency (chopped green beans) during lunch while being fed by nurse tech.    SLP Assessment  Patient needs continued Speech Language Pathology Services    Follow Up Recommendations  Inpatient Rehab    Frequency and Duration min 2x/week  2 weeks      SLP Evaluation Prior Functioning  Cognitive/Linguistic Baseline: Within functional limits Type of Home: House  Lives With: Alone Available Help at Discharge: Other (Comment) (TBD) Education:  (2 years post HS) Vocation: Retired   Associate Professor  Overall Cognitive Status: Within Functional Limits for tasks assessed Orientation Level: Oriented X4 Memory: Appears intact Awareness: Appears intact Problem Solving: Appears intact Safety/Judgment: Appears intact    Comprehension  Auditory Comprehension Overall Auditory Comprehension: Appears within functional limits for tasks assessed Yes/No Questions: Within Functional Limits Commands: Within Functional Limits Conversation: Complex Interfering Components: Visual impairments EffectiveTechniques: Increased volume;Pausing;Slowed speech Visual Recognition/Discrimination Discrimination: Within Function Limits Reading Comprehension Reading Status: Within funtional limits    Expression Expression Primary Mode of Expression: Verbal Verbal Expression Overall Verbal Expression: Impaired Initiation: No impairment Level of Generative/Spontaneous  Verbalization: Sentence;Conversation Repetition: Impaired Level of Impairment: Sentence level Naming: No impairment  Pragmatics: No impairment Interfering Components: Speech intelligibility Non-Verbal Means of Communication: Not applicable Written Expression Dominant Hand: Right Written Expression: Not tested   Oral / Motor Oral Motor/Sensory Function Overall Oral Motor/Sensory Function: Moderate impairment Facial ROM: Reduced left;Suspected CN VII (facial) dysfunction Facial Symmetry: Abnormal symmetry left;Suspected CN VII (facial) dysfunction Facial Strength: Reduced left;Suspected CN VII (facial) dysfunction Facial Sensation: Within Functional Limits Lingual ROM: Reduced left;Suspected CN XII (hypoglossal) dysfunction Lingual Symmetry: Within Functional Limits Lingual Strength: Reduced Lingual Sensation: Within Functional Limits Velum: Within Functional Limits Mandible: Within Functional Limits Motor Speech Overall Motor Speech: Appears within functional limits for tasks assessed Respiration: Within functional limits Phonation: Low vocal intensity Resonance: Within functional limits Articulation: Impaired Level of Impairment: Phrase Intelligibility: Intelligibility reduced Word: 50-74% accurate Phrase: 50-74% accurate Sentence: 50-74% accurate Conversation: 50-74% accurate Motor Planning: Witnin functional limits Motor Speech Errors: Not applicable Effective Techniques: Slow rate;Increased vocal intensity;Over-articulate    ADAMS,PAT, M.S., CCC-SLP 11/08/2015, 3:37 PM

## 2015-11-08 NOTE — Progress Notes (Addendum)
Physical Therapy Treatment Patient Details Name: Tyler Pierce MRN: [id number] DOB: 18-Feb-1946 Today's Date: 11/08/2015    History of Present Illness 69 y.o. male admitted for L sided weakness and numbness beginning on 11/05/15 and fell as a result of his symptoms. MRI (+) for acute R basal ganglia infarct and remote L basal ganglia infarct.    PT Comments    Patient with increased weakness LLE compared to 12/26 (evaluated by this therapist with knee extension and dorsiflexion 5/5; today 3-/5). Ambulation dramatically different. 12/26 pt's speech a bit difficult to understand, however conversing in full sentences. Today he would only gesture until PT directly asked him to state, "good afternoon" which he did but with weaker voice. Quillian Quince, RN notified and requested that he reassess pt as well.    Follow Up Recommendations  CIR     Equipment Recommendations  None recommended by PT    Recommendations for Other Services Rehab consult     Precautions / Restrictions Precautions Precautions: Fall Restrictions Weight Bearing Restrictions: No    Mobility  Bed Mobility Overal bed mobility: Needs Assistance Bed Mobility: Supine to Sit     Supine to sit: Mod assist;HOB elevated     General bed mobility comments: incr time and effort with assist to raise torso and scoot out to EOB  Transfers Overall transfer level: Needs assistance Equipment used: Rolling walker (2 wheeled) Transfers: Sit to/from Stand Sit to Stand: Mod assist         General transfer comment: assist for balance as moving hands up to RW; assist to fully stand  Ambulation/Gait Ambulation/Gait assistance: Max assist Ambulation Distance (Feet): 25 Feet Assistive device: Rolling walker (2 wheeled) Gait Pattern/deviations: Step-to pattern;Decreased step length - left;Decreased dorsiflexion - left;Decreased weight shift to right;Shuffle Gait velocity: decr Gait velocity interpretation: <1.8 ft/sec, indicative  of risk for recurrent falls General Gait Details: pt unable to independently advance LLE; assisted by PT (including assist weight-shift to his Rt, then advance LLE). At times LLE trailed behind RLE and pt would take 2-3 steps with rt--appeared decr attention to [employer]    Modified Rankin (Stroke Patients Only) Modified Rankin (Stroke Patients Only) Pre-Morbid Rankin Score: No symptoms Modified Rankin: Moderately severe disability     Balance Overall balance assessment: Needs assistance   Sitting balance-Leahy Scale: Fair     Standing balance support: Bilateral upper extremity supported Standing balance-Leahy Scale: Poor                      Cognition Arousal/Alertness: Awake/alert Behavior During Therapy: Flat affect Overall Cognitive Status: Difficult to assess                      Exercises General Exercises - Lower Extremity Ankle Circles/Pumps: AROM;Both;5 reps Long Arc Quad: AROM;Both;5 reps    General Comments        Pertinent Vitals/Pain Pain Assessment: No/denies pain Pain Score: 0-No pain Faces Pain Scale: No hurt    Home Living     Available Help at Discharge: Other (Comment) (TBD) Type of Home: House              Prior Function            PT Goals (current goals can now be found in the care plan section) Acute Rehab PT Goals Patient Stated Goal: to get stronger and be independent Time For  Goal Achievement: 11/14/15 Progress towards PT goals: Not progressing toward goals - comment (LLE weaker today)    Frequency  Min 4X/week    PT Plan Current plan remains appropriate    Co-evaluation             End of Session Equipment Utilized During Treatment: Gait belt Activity Tolerance: Patient limited by fatigue;Treatment limited secondary to medical complications (Comment) (incr weakness Lt side) Patient left: in chair;with call bell/phone within reach;with chair alarm set      Time: 7014-1030 PT Time Calculation (min) (ACUTE ONLY): 25 min  Charges:  $Gait Training: 8-22 mins $Therapeutic Activity: 8-22 mins                    G Codes:      Raelin Pixler 2015/11/14, 4:31 PM Pager [phone]

## 2015-11-08 NOTE — Progress Notes (Signed)
I spoke with the patient regarding IP Rehab recommendation.  Pt. Expresses he would want to come to IP Rehab . Pt. Permitted me to call his friend Jamesetta Orleans to confirm living arrangements.  Pt. Lives in a "shed" in the back of Justice's home.  The space is heated and air conditioned.  He does not have indoor plumbing and cooks in the microwave.  Justice stated pt. Can use the rest room in her home but needs to be independent as she has 6 children to take care of and will not be able to assist pt.  I have notified Jacqualin Combes, [facility] and have texted Bashira Nixon's pager to request SNF backup in the event that pt. Does not get to come to CIR.  Will update the team when insurance gives [place] a decision.  Please call if questions.  [place] Admissions Coordinator Cell [phone] Office [phone]

## 2015-11-08 NOTE — Progress Notes (Signed)
Nutrition Follow-up   INTERVENTION:  Provide Magic cup PO BID with meals, each supplement provides 290 kcal and 9 grams of protein  Monitor PO intake for adequacy  NUTRITION DIAGNOSIS:   Predicted suboptimal nutrient intake related to dysphagia, acute illness as evidenced by meal completion < 25%.   GOAL:   Patient will meet greater than or equal to 90% of their needs  Unmet  MONITOR:   Diet advancement, Labs, Weight trends, Skin, I & O's  REASON FOR ASSESSMENT:   Malnutrition Screening Tool    ASSESSMENT:   Tyler Pierce is a 69 y.o. male who was brought to the ED he had been found on the ground in a shed. He reports that he had sudden onset of left sided weakness and fell onto the ground. He could not get back up due to the weakness. His last know well was a 0500 AM on 11/05/2015. He denies having any preceding headache or dizziness. He denies having chest pain. He has a history of heavy drinking, and reports that his last drink was 2 weeks ago because he had GI virus. He was evlauted in the ED and a CT scan of the heaad was negative for acute findings. He was seen by Neurology and referred for admission.   Pt was advanced to a dysphagia 2 diet with honey-thick liquids yesterday evening. Pt did not speak at time of visit; he answered a few questions by nodding. Per nursing notes, pt ate 15% of breakfast. Pt reports eating well at lunch. RD emphasized the importance of getting adequate nutrition. Pt agreeable to receiving Magic Cup ice cream supplements with meals.   Labs: low calcium  Diet Order:  DIET DYS 2 Room service appropriate?: Yes; Fluid consistency:: Honey Thick  Skin:  Reviewed, no issues  Last BM:  12/24  Height:   Ht Readings from Last 1 Encounters:  11/06/15 6' (1.829 m)    Weight:   Wt Readings from Last 1 Encounters:  11/06/15 153 lb 11.2 oz (69.718 kg)    Ideal Body Weight:  80.9 kg  BMI:  Body mass index is 20.84  kg/(m^2).  Estimated Nutritional Needs:   Kcal:  1700-1900  Protein:  75-90 grams  Fluid:  1.7-1.9 L  EDUCATION NEEDS:   Education needs addressed  Scarlette Ar RD, LDN Inpatient Clinical Dietitian Pager: [phone] After Hours Pager: [phone]

## 2015-11-08 NOTE — Clinical Social Work Note (Signed)
Clinical Social Work Assessment  Patient Details  Name: Tyler Pierce MRN: [id number] Date of Birth: 1946/03/06  Date of referral:  11/08/15               Reason for consult:  Facility Placement                Permission sought to share information with:  Facility Sport and exercise psychologist, Family Supports Permission granted to share information::  Yes, Verbal Permission Granted  Name::     Justice  Agency::  [facility]  Relationship::  Friend  Contact Information:  [phone]  Housing/Transportation Living arrangements for the past 2 months:  Single Family Home Source of Information:  Friend/Neighbor Patient Interpreter Needed:  None Criminal Activity/Legal Involvement Pertinent to Current Situation/Hospitalization:  No - Comment as needed Significant Relationships:  Friend Lives with:  Self Do you feel safe going back to the place where you live?  No Need for family participation in patient care:  No (Coment)  Care giving concerns:  CSW received referral for possible SNF placement at time of discharge. CSW spoke w/ patient's friend, Justice, regarding PT recommendation of SNF placement at time of discharge. Per Justice, patient is currently unable to care for patient at his home given patient's current physical needs and fall risk. Patient's friend expressed understanding of PT recommendation and is agreeable to SNF placement at time of discharge. CSW to continue to follow and assist with discharge planning needs.   Social Worker assessment / plan:  CSW spoke with patient's friend concerning possibility of rehab at [facility] before returning home if CIR is unable to admit patient.  Employment status:  Retired Forensic scientist:    PT Recommendations:  [place], [place] / Referral to community resources:  [place]  Patient/Family's Response to care:  Patient's friend recognizes need for rehab before returning home  and is agreeable to a SNF in [place] if CIR is unable to admit patient.  Patient/Family's Understanding of and Emotional Response to Diagnosis, Current Treatment, and Prognosis:  Patient is realistic regarding therapy needs. No questions/concerns about plan or treatment.    Emotional Assessment Appearance:   (Patient disoriented; completed assessment w/friend) Attitude/Demeanor/Rapport:    Affect (typically observed):   (Patient disoriented) Orientation:  Oriented to Self, Oriented to Place Alcohol / Substance use:  Not Applicable Psych involvement (Current and /or in the community):  No (Comment)  Discharge Needs  Concerns to be addressed:  Care Coordination Readmission within the last 30 days:  No Current discharge risk:  None Barriers to Discharge:  Continued Medical Work up   [employer], [place] 11/08/2015, 5:13 PM

## 2015-11-08 NOTE — Progress Notes (Signed)
Patient Demographics  Tyler Pierce, is a 69 y.o. male, DOB - Nov 07, 1946, XTK:[id number]  Admit date - 11/05/2015   Admitting Physician Theressa Millard, MD  Outpatient Primary MD for the patient is No PCP Per Patient  LOS - 2   Chief Complaint  Patient presents with  . Chest Pain       Admission HPI/Brief narrative: 69 y.o. male with no r past medical history presents  with left sided weakness, MRI brain significant for acute nonhemorrhagic infarct in right basal ganglia.  Subjective:   Tyler Pierce today has, No headache, No chest pain, No abdominal pain - reports left-sided weakness feels the same, no further cough.  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 with multiple remote infarcts. - MRA - Diffuse distal medium and small vessel disease. - Carotid Doppler:1-39% internal carotid artery stenosis bilaterally. The left vertebral artery is patent with antegrade flow. Unable to visualize the right vertebral artery. - 2-D echo with no evidence of embolic source identified  - No antithrombotic prior to admission, currently on aspirin 325 mg oral daily - LDL is 73,  started on Lipitor 10 mg. - Hemoglobin A1c 5.5 - Seen by SLP, initially nothing by mouth, had modified barium study done , diet advanced to dysphagia 2 with nectar thick. -  discussed with cardiology who will arrange for  30 days monitor as an outpatient. - Patient is been evaluated by CIR.  Hypokalemia - Repleted  Tobacco abuse - Patient was counseled  - Patient denies alcohol abuse  Code Status: Full  Family Communication: none at bedside  Disposition Plan:  discharge for CIR vs SNF tomorrow.   Procedures none   Consults  neurology   Medications  Scheduled Meds: . aspirin  325 mg Oral Daily  . atorvastatin  10 mg Oral q1800  . enoxaparin (LOVENOX) injection  40 mg Subcutaneous Daily   Continuous Infusions: . 0.9 % NaCl with KCl 20 mEq / L 75 mL/hr at 11/07/15 0505   PRN Meds:.guaiFENesin-dextromethorphan, senna-docusate  DVT Prophylaxis  Lovenox -  Lab Results  Component Value Date   PLT 122* 11/06/2015    Antibiotics   Anti-infectives    None          Objective:   Filed Vitals:   11/08/15 0117 11/08/15 0535 11/08/15 0958 11/08/15 1425  BP: 133/60 128/78 125/57 110/65  Pulse: 60 61 59 75  Temp: 98.3 F (36.8 C) 98.5 F (36.9 C) 98.5 F (36.9 C) 98.2 F (36.8 C)  TempSrc: Oral Oral Oral Oral  Resp: '20 18 16 15  '$ Height:      Weight:      SpO2: 94% 95% 93% 96%    Wt  Readings from Last 3 Encounters:  11/06/15 69.718 kg (153 lb 11.2 oz)     Intake/Output Summary (Last 24 hours) at 11/08/15 1512 Last data filed at 11/08/15 1045  Gross per 24 hour  Intake     60 ml  Output    700 ml  Net   -640 ml     Physical Exam  Awake Alert,with mild aphasia Supple Neck,No JVD,  Symmetrical Chest wall movement, Good air movement bilaterally, no use of accessory muscles  RRR,No Gallops,Rubs or new Murmurs, No Parasternal Heave +ve B.Sounds, Abd Soft, No tenderness, No organomegaly appriciated, No rebound - guarding or rigidity. No Cyanosis, Clubbing or edema, significant left side weakness upper more than lower extremities   Data Review   Micro Results No results found for this or any previous visit (from the past 240 hour(s)).  Radiology Reports Dg  Chest 2 View  11/06/2015  CLINICAL DATA:  Acute onset of confusion. Recent fall, with concern for chest injury. Cough. Initial encounter. EXAM: CHEST  2 VIEW COMPARISON:  Chest radiograph performed 10/19/2015 FINDINGS: The lungs are well-aerated and clear. Density overlying the left lung base is thought to reflect a lead. There is no evidence of focal opacification, pleural effusion or pneumothorax. The heart is normal in size; the mediastinal contour is within normal limits. No acute osseous abnormalities are seen. IMPRESSION: No acute cardiopulmonary process seen. No displaced rib fractures identified. Electronically Signed   By: Garald Balding M.D.   On: 11/06/2015 01:26   Dg Chest 2 View  10/19/2015  CLINICAL DATA:  Cough and chest congestion. Shortness breath. Chest pain. Weakness. COPD. EXAM: CHEST  2 VIEW COMPARISON:  10/17/2006 FINDINGS: The heart size and mediastinal contours are within normal limits. Both lungs are clear. Pulmonary hyperinflation again seen, consistent with COPD. No evidence of pneumothorax or pleural effusion. The visualized skeletal structures are unremarkable. IMPRESSION: COPD.  No active cardiopulmonary disease. Electronically Signed   By: Earle Gell M.D.   On: 10/19/2015 17:11   Dg Elbow Complete Left  11/06/2015  CLINICAL DATA:  Acute onset of confusion. Recent fall, with laceration at the posterior left elbow. Initial encounter. EXAM: LEFT ELBOW - COMPLETE 3+ VIEW COMPARISON:  Left elbow radiographs performed 08/18/2004 FINDINGS: There is no evidence of fracture or dislocation. The visualized plate and screws along the olecranon is grossly intact, without evidence of loosening. The visualized joint spaces are preserved. No significant joint effusion is identified. The soft tissues are unremarkable in appearance. IMPRESSION: No evidence of fracture or dislocation. Hardware appears grossly intact. Electronically Signed   By: Garald Balding M.D.   On: 11/06/2015 01:23   Ct  Head Wo Contrast  11/06/2015  CLINICAL DATA:  Acute onset of left upper and lower extremity weakness and numbness. Status post fall. Left facial numbness. Concern for cervical spine injury. Initial encounter. EXAM: CT HEAD WITHOUT CONTRAST CT CERVICAL SPINE WITHOUT CONTRAST TECHNIQUE: Multidetector CT imaging of the head and cervical spine was performed following the standard protocol without intravenous contrast. Multiplanar CT image reconstructions of the cervical spine were also generated. COMPARISON:  CT of the head and cervical spine performed 10/19/2015 FINDINGS: CT HEAD FINDINGS There is no evidence of acute infarction, mass lesion, or intra- or extra-axial hemorrhage on CT. Prominence of the ventricles and sulci suggests mild cortical volume loss. Mild cerebellar atrophy is noted. Scattered periventricular and subcortical white matter change likely reflects small vessel ischemic microangiopathy. Chronic lacunar infarcts are seen at the basal ganglia bilaterally.  The brainstem and fourth ventricle are within normal limits. The cerebral hemispheres demonstrate grossly normal gray-white differentiation. No mass effect or midline shift is seen. There is no evidence of fracture; visualized osseous structures are unremarkable in appearance. The orbits are within normal limits. Mucosal thickening is noted at the maxillary sinuses bilaterally. The remaining paranasal sinuses and mastoid air cells are well-aerated. No significant soft tissue abnormalities are seen. CT CERVICAL SPINE FINDINGS There is no evidence of fracture or subluxation. Vertebral bodies demonstrate normal height and alignment. Minimal multilevel disc space narrowing is noted along the cervical spine, with scattered endplate subcortical cystic change. Scattered small anterior and posterior disc osteophyte complexes are noted along the cervical spine. Prevertebral soft tissues are within normal limits. The thyroid gland is unremarkable in  appearance. Emphysematous change is noted at the lung apices, with minimal associated scarring. No significant soft tissue abnormalities are seen. IMPRESSION: 1. No evidence of traumatic intracranial injury or fracture. 2. No evidence of fracture or subluxation along the cervical spine. 3. Mild cortical volume loss and scattered small vessel ischemic microangiopathy. Chronic lacunar infarcts at the basal ganglia bilaterally. 4. Minimal degenerative change along the cervical spine. 5. Emphysematous change at the lung apices, with minimal associated scarring. Electronically Signed   By: Garald Balding M.D.   On: 11/06/2015 01:34   Ct Head Wo Contrast  10/19/2015  CLINICAL DATA:  Dizziness with fall.  Pain after fall EXAM: CT HEAD WITHOUT CONTRAST CT CERVICAL SPINE WITHOUT CONTRAST TECHNIQUE: Multidetector CT imaging of the head and cervical spine was performed following the standard protocol without intravenous contrast. Multiplanar CT image reconstructions of the cervical spine were also generated. COMPARISON:  None. FINDINGS: CT HEAD FINDINGS There is mild diffuse atrophy with slightly greater parietal lobe atrophy than elsewhere. There is no intracranial mass, hemorrhage, extra-axial fluid collection, or midline shift. There is small vessel disease throughout the centra semiovale bilaterally. There is evidence of a prior focal lacunar infarct in the inferior mid left centrum semiovale. There is evidence of an age uncertain small infarct in the anterior limb of the left external capsule. The bony calvarium appears intact. The mastoid air cells are clear. There is mucosal thickening in both maxillary antra as well as in multiple ethmoid air cells, the inferior frontal sinuses bilaterally, and the right sphenoid sinus. No intraorbital lesions are identified. CT CERVICAL SPINE FINDINGS There is no fracture or spondylolisthesis. Prevertebral soft tissues and predental space regions are normal. There is moderate disc  space narrowing at C3-4, C4-5, C5-6, and C6-7. There are anterior osteophytes at C3, C4, C5, and C6. There is facet hypertrophy at multiple levels. Exit foraminal narrowing is noted at C3-4, C4-5, C5-6, and C6-7 bilaterally. No disc extrusion or high-grade stenosis. There are scattered foci of coronary artery calcification bilaterally. There is bullous disease in each lung apex. IMPRESSION: CT head: Atrophy with extensive periventricular small vessel disease. 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 No hemorrhage or mass effect. Multifocal paranasal sinus disease. CT cervical spine: No fracture or spondylolisthesis. Multilevel osteoarthritic change. Scattered calcification in the carotid arteries bilaterally. Bullous disease in each lung apex. Electronically Signed   By: Lowella Grip III M.D.   On: 10/19/2015 19:52   Ct Cervical Spine Wo Contrast  11/06/2015  CLINICAL DATA:  Acute onset of left upper and lower extremity weakness and numbness. Status post fall. Left facial numbness. Concern for cervical spine injury. Initial encounter. EXAM: CT HEAD WITHOUT CONTRAST  CT CERVICAL SPINE WITHOUT CONTRAST TECHNIQUE: Multidetector CT imaging of the head and cervical spine was performed following the standard protocol without intravenous contrast. Multiplanar CT image reconstructions of the cervical spine were also generated. COMPARISON:  CT of the head and cervical spine performed 10/19/2015 FINDINGS: CT HEAD FINDINGS There is no evidence of acute infarction, mass lesion, or intra- or extra-axial hemorrhage on CT. Prominence of the ventricles and sulci suggests mild cortical volume loss. Mild cerebellar atrophy is noted. Scattered periventricular and subcortical white matter change likely reflects small vessel ischemic microangiopathy. Chronic lacunar infarcts are seen at the basal ganglia bilaterally. The brainstem and fourth ventricle are within normal limits. The cerebral  hemispheres demonstrate grossly normal gray-white differentiation. No mass effect or midline shift is seen. There is no evidence of fracture; visualized osseous structures are unremarkable in appearance. The orbits are within normal limits. Mucosal thickening is noted at the maxillary sinuses bilaterally. The remaining paranasal sinuses and mastoid air cells are well-aerated. No significant soft tissue abnormalities are seen. CT CERVICAL SPINE FINDINGS There is no evidence of fracture or subluxation. Vertebral bodies demonstrate normal height and alignment. Minimal multilevel disc space narrowing is noted along the cervical spine, with scattered endplate subcortical cystic change. Scattered small anterior and posterior disc osteophyte complexes are noted along the cervical spine. Prevertebral soft tissues are within normal limits. The thyroid gland is unremarkable in appearance. Emphysematous change is noted at the lung apices, with minimal associated scarring. No significant soft tissue abnormalities are seen. IMPRESSION: 1. No evidence of traumatic intracranial injury or fracture. 2. No evidence of fracture or subluxation along the cervical spine. 3. Mild cortical volume loss and scattered small vessel ischemic microangiopathy. Chronic lacunar infarcts at the basal ganglia bilaterally. 4. Minimal degenerative change along the cervical spine. 5. Emphysematous change at the lung apices, with minimal associated scarring. Electronically Signed   By: Garald Balding M.D.   On: 11/06/2015 01:34   Ct Cervical Spine Wo Contrast  10/19/2015  CLINICAL DATA:  Dizziness with fall.  Pain after fall EXAM: CT HEAD WITHOUT CONTRAST CT CERVICAL SPINE WITHOUT CONTRAST TECHNIQUE: Multidetector CT imaging of the head and cervical spine was performed following the standard protocol without intravenous contrast. Multiplanar CT image reconstructions of the cervical spine were also generated. COMPARISON:  None. FINDINGS: CT HEAD  FINDINGS There is mild diffuse atrophy with slightly greater parietal lobe atrophy than elsewhere. There is no intracranial mass, hemorrhage, extra-axial fluid collection, or midline shift. There is small vessel disease throughout the centra semiovale bilaterally. There is evidence of a prior focal lacunar infarct in the inferior mid left centrum semiovale. There is evidence of an age uncertain small infarct in the anterior limb of the left external capsule. The bony calvarium appears intact. The mastoid air cells are clear. There is mucosal thickening in both maxillary antra as well as in multiple ethmoid air cells, the inferior frontal sinuses bilaterally, and the right sphenoid sinus. No intraorbital lesions are identified. CT CERVICAL SPINE FINDINGS There is no fracture or spondylolisthesis. Prevertebral soft tissues and predental space regions are normal. There is moderate disc space narrowing at C3-4, C4-5, C5-6, and C6-7. There are anterior osteophytes at C3, C4, C5, and C6. There is facet hypertrophy at multiple levels. Exit foraminal narrowing is noted at C3-4, C4-5, C5-6, and C6-7 bilaterally. No disc extrusion or high-grade stenosis. There are scattered foci of coronary artery calcification bilaterally. There is bullous disease in each lung apex. IMPRESSION: CT head: Atrophy with extensive  periventricular small vessel disease. 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 No hemorrhage or mass effect. Multifocal paranasal sinus disease. CT cervical spine: No fracture or spondylolisthesis. Multilevel osteoarthritic change. Scattered calcification in the carotid arteries bilaterally. Bullous disease in each lung apex. Electronically Signed   By: Lowella Grip III M.D.   On: 10/19/2015 19:52   Mr Brain Wo Contrast  11/06/2015  CLINICAL DATA:  Sudden onset of left-sided weakness involving the face, upper and lower extremity beginning over 24 hours ago. The patient fell  due to weakness. EXAM: MRI HEAD WITHOUT CONTRAST MRA HEAD WITHOUT CONTRAST TECHNIQUE: Multiplanar, multiecho pulse sequences of the brain and surrounding structures were obtained without intravenous contrast. Angiographic images of the head were obtained using MRA technique without contrast. COMPARISON:  CT head without contrast 10/19/2015. FINDINGS: MRI HEAD FINDINGS An acute nonhemorrhagic infarct within the right basal ganglia measures 2.7 cm maximally. There is associated T2 signal change in edema with this infarct. A remote infarct in the left basal ganglia measures 10 mm. There is no acute hemorrhage or mass lesion. Moderate to severe diffuse periventricular and subcortical white matter changes are evident bilaterally. Additional smaller remote lacunar infarcts are present within the basal ganglia bilaterally. The ventricles are proportionate to the degree of atrophy. No significant extra-axial fluid collection is present. The brainstem and cerebellum are unremarkable. The right vertebral artery is hypoplastic. Flow is present in the major intracranial arteries. The globes and orbits are intact. There is near complete opacification of the left maxillary sinus. Prominent mucosal thickening is noted along the floor of the right maxillary sinus with a small fluid level. The paranasal sinuses and mastoid air cells are otherwise clear. The skullbase is within normal limits. Midline sagittal images are unremarkable MRA HEAD FINDINGS The internal carotid arteries are within normal limits from the high cervical segments through the ICA termini bilaterally. The A1 and M1 segments are intact. The anterior communicating artery is patent. The MCA bifurcations are intact. Moderate medium and small vessel attenuation is symmetric. The left vertebral artery is the dominant vessel. The right vertebral artery is hypoplastic above the dural margin. The basilar artery is intact. Both posterior cerebral arteries originate from  the basilar tip. There is moderate attenuation of distal PCA branch vessels bilaterally. IMPRESSION: 1. 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 2. Remote 10 mm nonhemorrhagic infarct of the left basal ganglia in a similar location. 3. Additional smaller remote lacunar infarcts are present in the basal ganglia bilaterally. 4. Advanced diffuse confluent periventricular and subcortical white matter disease bilaterally compatible with severe microvascular ischemia. 5. The MRA demonstrates diffuse distal medium and small vessel disease without significant proximal stenosis, aneurysm, or branch vessel occlusion. These results will be called to the ordering clinician or representative by the Radiologist Assistant, and communication documented in the PACS or zVision Dashboard. Electronically Signed   By: San Morelle M.D.   On: 11/06/2015 08:30   Dg Knee Complete 4 Views Left  11/06/2015  CLINICAL DATA:  Acute onset of confusion. Status post fall, with anterior left knee laceration. Initial encounter. EXAM: LEFT KNEE - COMPLETE 4+ VIEW COMPARISON:  None. FINDINGS: There is no evidence of fracture or dislocation. The joint spaces are preserved. No significant degenerative change is seen; the patellofemoral joint is grossly unremarkable in appearance. A small knee joint effusion is suggested. The visualized soft tissues are otherwise unremarkable in appearance. IMPRESSION: 1. No evidence  of fracture or dislocation. 2. Small knee joint effusion suggested. Electronically Signed   By: Garald Balding M.D.   On: 11/06/2015 01:25   Dg Swallowing Func-speech Pathology  11/07/2015  Objective Swallowing Evaluation:   MBS Patient Details Name: Tyler Pierce MRN: [id number] Date of Birth: 07-31-1946 Today's Date: 11/07/2015 Time: SLP Start Time (ACUTE ONLY): 1330-SLP Stop Time (ACUTE ONLY): 1346 SLP Time Calculation (min) (ACUTE ONLY): 16 min Past Medical History: No past medical  history on file. Past Surgical History: Past Surgical History Procedure Laterality Date . Finger amputation   HPI: MALICK NETZ is an 69 y.o. male with no reported past medical history presenting with sudden onset of left sided weakness. Out of tPA window. MRI: acute nonhemorrhagic infarct within the right basal ganglia No Data Recorded Assessment / Plan / Recommendation CHL IP CLINICAL IMPRESSIONS 11/07/2015 Therapy Diagnosis Moderate oral phase dysphagia;Moderate pharyngeal phase dysphagia Clinical Impression Patient presents with a moderate oropharyngeal dysphagia characterized by CN VII and XII involvement impacting left sided facial/lingual strength and sensory impairments. Combination of above results in anterior labial loss of bolus, delayed oral transit of bolus, and delayed swallow initiation with deep penetration of liquids thinner than honey thick. Penetration sensed only when reaches the level of the vocal cords and cough response inconsistent to clear. Cues for chin tuck ineffective to increase airway protection. Recommend initiation of conservative diet with close SLP f/u to monitor for diet tolerance and potential to advance.   Impact on safety and function Moderate aspiration risk   CHL IP TREATMENT RECOMMENDATION 11/07/2015 Treatment Recommendations Therapy as outlined in treatment plan below   Prognosis 11/07/2015 Prognosis for Safe Diet Advancement Good Barriers to Reach Goals -- Barriers/Prognosis Comment -- CHL IP DIET RECOMMENDATION 11/07/2015 SLP Diet Recommendations Dysphagia 2 (Fine chop) solids;Honey thick liquids Liquid Administration via Spoon Medication Administration Crushed with puree Compensations Slow rate;Small sips/bites Postural Changes Seated upright at 90 degrees   CHL IP OTHER RECOMMENDATIONS 11/07/2015 Recommended Consults -- Oral Care Recommendations Oral care BID Other Recommendations Order thickener from pharmacy;Prohibited food (jello, ice cream, thin soups);Remove water  pitcher   CHL IP FOLLOW UP RECOMMENDATIONS 11/07/2015 Follow up Recommendations Inpatient Rehab   CHL IP FREQUENCY AND DURATION 11/07/2015 Speech Therapy Frequency (ACUTE ONLY) min 2x/week Treatment Duration 2 weeks      CHL IP ORAL PHASE 11/07/2015 Oral Phase Impaired Oral - Pudding Teaspoon -- Oral - Pudding Cup -- Oral - Honey Teaspoon Left anterior bolus loss;Lingual/palatal residue;Delayed oral transit Oral - Honey Cup -- Oral - Nectar Teaspoon Left anterior bolus loss;Lingual/palatal residue;Delayed oral transit Oral - Nectar Cup Left anterior bolus loss;Lingual/palatal residue;Delayed oral transit Oral - Nectar Straw -- Oral - Thin Teaspoon -- Oral - Thin Cup Left anterior bolus loss;Lingual/palatal residue;Delayed oral transit Oral - Thin Straw -- Oral - Puree Lingual/palatal residue;Delayed oral transit Oral - Mech Soft Lingual/palatal residue;Delayed oral transit Oral - Regular -- Oral - Multi-Consistency -- Oral - Pill -- Oral Phase - Comment --  CHL IP PHARYNGEAL PHASE 11/07/2015 Pharyngeal Phase Impaired Pharyngeal- Pudding Teaspoon -- Pharyngeal -- Pharyngeal- Pudding Cup -- Pharyngeal -- Pharyngeal- Honey Teaspoon Delayed swallow initiation-pyriform sinuses Pharyngeal -- Pharyngeal- Honey Cup -- Pharyngeal -- Pharyngeal- Nectar Teaspoon Delayed swallow initiation-pyriform sinuses;Penetration/Aspiration before swallow;Penetration/Aspiration during swallow Pharyngeal Material enters airway, CONTACTS cords and not ejected out Pharyngeal- Nectar Cup Delayed swallow initiation-pyriform sinuses;Penetration/Aspiration before swallow;Penetration/Aspiration during swallow Pharyngeal Material enters airway, CONTACTS cords and not ejected out Pharyngeal- Nectar Straw -- Pharyngeal -- Pharyngeal- Thin Teaspoon --  Pharyngeal -- Pharyngeal- Thin Cup Delayed swallow initiation-pyriform sinuses;Penetration/Aspiration before swallow;Penetration/Aspiration during swallow Pharyngeal Material enters airway, CONTACTS  cords and not ejected out Pharyngeal- Thin Straw -- Pharyngeal -- Pharyngeal- Puree Delayed swallow initiation-vallecula Pharyngeal -- Pharyngeal- Mechanical Soft Delayed swallow initiation-vallecula Pharyngeal -- Pharyngeal- Regular -- Pharyngeal -- Pharyngeal- Multi-consistency -- Pharyngeal -- Pharyngeal- Pill -- Pharyngeal -- Pharyngeal Comment --  CHL IP CERVICAL ESOPHAGEAL PHASE 11/07/2015 Cervical Esophageal Phase WFL Pudding Teaspoon -- Pudding Cup -- Honey Teaspoon -- Honey Cup -- Nectar Teaspoon -- Nectar Cup -- Nectar Straw -- Thin Teaspoon -- Thin Cup -- Thin Straw -- Puree -- Mechanical Soft -- Regular -- Multi-consistency -- Pill -- Cervical Esophageal Comment -- Gabriel Rainwater MA, CCC-SLP [phone] Gabriel Rainwater [name] 11/07/2015, 1:58 PM              Mr Tyler Pierce Head/brain Wo Cm  11/06/2015  CLINICAL DATA:  Sudden onset of left-sided weakness involving the face, upper and lower extremity beginning over 24 hours ago. The patient fell due to weakness. EXAM: MRI HEAD WITHOUT CONTRAST MRA HEAD WITHOUT CONTRAST TECHNIQUE: Multiplanar, multiecho pulse sequences of the brain and surrounding structures were obtained without intravenous contrast. Angiographic images of the head were obtained using MRA technique without contrast. COMPARISON:  CT head without contrast 10/19/2015. FINDINGS: MRI HEAD FINDINGS An acute nonhemorrhagic infarct within the right basal ganglia measures 2.7 cm maximally. There is associated T2 signal change in edema with this infarct. A remote infarct in the left basal ganglia measures 10 mm. There is no acute hemorrhage or mass lesion. Moderate to severe diffuse periventricular and subcortical white matter changes are evident bilaterally. Additional smaller remote lacunar infarcts are present within the basal ganglia bilaterally. The ventricles are proportionate to the degree of atrophy. No significant extra-axial fluid collection is present. The brainstem and cerebellum are unremarkable.  The right vertebral artery is hypoplastic. Flow is present in the major intracranial arteries. The globes and orbits are intact. There is near complete opacification of the left maxillary sinus. Prominent mucosal thickening is noted along the floor of the right maxillary sinus with a small fluid level. The paranasal sinuses and mastoid air cells are otherwise clear. The skullbase is within normal limits. Midline sagittal images are unremarkable MRA HEAD FINDINGS The internal carotid arteries are within normal limits from the high cervical segments through the ICA termini bilaterally. The A1 and M1 segments are intact. The anterior communicating artery is patent. The MCA bifurcations are intact. Moderate medium and small vessel attenuation is symmetric. The left vertebral artery is the dominant vessel. The right vertebral artery is hypoplastic above the dural margin. The basilar artery is intact. Both posterior cerebral arteries originate from the basilar tip. There is moderate attenuation of distal PCA branch vessels bilaterally. IMPRESSION: 1. 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 2. Remote 10 mm nonhemorrhagic infarct of the left basal ganglia in a similar location. 3. Additional smaller remote lacunar infarcts are present in the basal ganglia bilaterally. 4. Advanced diffuse confluent periventricular and subcortical white matter disease bilaterally compatible with severe microvascular ischemia. 5. The MRA demonstrates diffuse distal medium and small vessel disease without significant proximal stenosis, aneurysm, or branch vessel occlusion. These results will be called to the ordering clinician or representative by the Radiologist Assistant, and communication documented in the PACS or zVision Dashboard. Electronically Signed   By: San Morelle M.D.   On: 11/06/2015 08:30     CBC  Recent Labs Lab  11/06/15 0028 11/06/15 0036  WBC 6.4  --   HGB 12.9* 13.9   HCT 36.6* 41.0  PLT 122*  --   MCV 108.9*  --   MCH 38.4*  --   MCHC 35.2  --   RDW 12.7  --   LYMPHSABS 1.9  --   MONOABS 0.5  --   EOSABS 0.1  --   BASOSABS 0.0  --     Chemistries   Recent Labs Lab 11/06/15 0028 11/06/15 0036 11/07/15 0610  NA 135 136 138  K 3.4* 3.2* 3.7  CL 100* 97* 106  CO2 25  --  23  GLUCOSE 96 92 71  BUN '10 11 11  '$ CREATININE 0.99 0.90 0.96  CALCIUM 8.5*  --  8.1*  MG  --   --  1.9  AST 29  --   --   ALT 25  --   --   ALKPHOS 54  --   --   BILITOT 2.1*  --   --    ------------------------------------------------------------------------------------------------------------------ estimated creatinine clearance is 72.6 mL/min (by C-G formula based on Cr of 0.96). ------------------------------------------------------------------------------------------------------------------  Recent Labs  11/06/15 0433  HGBA1C 5.5   ------------------------------------------------------------------------------------------------------------------  Recent Labs  11/06/15 0433  CHOL 124  HDL 37*  LDLCALC 73  TRIG 69  CHOLHDL 3.4   ------------------------------------------------------------------------------------------------------------------ No results for input(s): TSH, T4TOTAL, T3FREE, THYROIDAB in the last 72 hours.  Invalid input(s): FREET3 ------------------------------------------------------------------------------------------------------------------ No results for input(s): VITAMINB12, FOLATE, FERRITIN, TIBC, IRON, RETICCTPCT in the last 72 hours.  Coagulation profile  Recent Labs Lab 11/06/15 0028  INR 1.17    No results for input(s): DDIMER in the last 72 hours.  Cardiac Enzymes No results for input(s): CKMB, TROPONINI, MYOGLOBIN in the last 168 hours.  Invalid input(s): CK ------------------------------------------------------------------------------------------------------------------ Invalid input(s): POCBNP     Time  Spent in minutes   20 minutes   Kindra Bickham M.D on 11/08/2015 at 3:12 PM  Between 7am to 7pm - Pager - [phone]  After 7pm go to [email or web address] - password [facility]  [facility]   Office  [phone]

## 2015-11-08 NOTE — Progress Notes (Signed)
Utilization Review Completed.Donne Anon T12/27/2016

## 2015-11-08 NOTE — NC FL2 (Signed)
  [place] MEDICAID FL2 LEVEL OF CARE SCREENING TOOL     IDENTIFICATION  Patient Name: Tyler Pierce Birthdate: 1946/09/12 Sex: male Admission Date (Current Location): 11/05/2015  [facility] and [place] Number:  Herbalist and Address:  The [place], [place] [address], [place], [place] [address]      Provider Number: [id number]  Attending Physician Name and Address:  Albertine Patricia, MD  Relative Name and Phone Number:  Gwynneth Aliment [phone]    Current Level of Care: Hospital Recommended Level of Care: [place] Prior Approval Number:    Date Approved/Denied:   PASRR Number: [id number]  Discharge Plan: SNF    Current Diagnoses: Patient Active Problem List   Diagnosis Date Noted  . HLD (hyperlipidemia)   . Tobacco abuse   . Hemiparesis affecting left side as late effect of stroke ([place])   . 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 Dysphagia as late effect of cerebrovascular disease   . Left-sided weakness 11/06/2015  . CVA (cerebral infarction) 11/06/2015  . Fall 11/06/2015  . Hypokalemia 11/06/2015  . Thrombocytopenia ([place]) 11/06/2015  . Alcohol abuse 11/06/2015    Orientation RESPIRATION BLADDER Height & Weight    Self, Place  Normal Continent   153 lbs.  BEHAVIORAL SYMPTOMS/MOOD NEUROLOGICAL BOWEL NUTRITION STATUS   (N/A)  (N/A) Continent  (See DC Summary)  AMBULATORY STATUS COMMUNICATION OF NEEDS Skin   Extensive Assist Verbally Normal                       Personal Care Assistance Level of Assistance  Bathing, Feeding, Dressing Bathing Assistance: Maximum assistance Feeding assistance: Independent Dressing Assistance: Maximum assistance     Functional Limitations Info             SPECIAL CARE FACTORS FREQUENCY  PT (By licensed PT)     PT Frequency: 5x/week              Contractures      Additional Factors Info  Code Status, Allergies Code Status Info:  Full Allergies Info: Penicillins           Current Medications (11/08/2015):  This is the current hospital active medication list Current Facility-Administered Medications  Medication Dose Route Frequency Provider Last Rate Last Dose  . 0.9 % NaCl with KCl 20 mEq/ L  infusion   Intravenous Continuous Albertine Patricia, MD 75 mL/hr at 11/07/15 0505    . aspirin tablet 325 mg  325 mg Oral Daily Albertine Patricia, MD   325 mg at 11/08/15 1012  . atorvastatin (LIPITOR) tablet 10 mg  10 mg Oral q1800 Albertine Patricia, MD   10 mg at 11/07/15 1858  . enoxaparin (LOVENOX) injection 40 mg  40 mg Subcutaneous Daily Theressa Millard, MD   40 mg at 11/08/15 1013  . guaiFENesin-dextromethorphan (ROBITUSSIN DM) 100-10 MG/5ML syrup 5 mL  5 mL Oral Q4H PRN Dianne Dun, NP      . senna-docusate (Senokot-S) tablet 1 tablet  1 tablet Oral QHS PRN Theressa Millard, MD         Discharge Medications: Please see discharge summary for a list of discharge medications.  Relevant Imaging Results:  Relevant Lab Results:   Additional Information SSN: [id number]  Benard Halsted, LCSWA

## 2015-11-08 NOTE — NC FL2 (Signed)
   MEDICAID FL2 LEVEL OF CARE SCREENING TOOL     IDENTIFICATION  Patient Name: Tyler Pierce Birthdate: 10-Oct-1946 Sex: male Admission Date (Current Location): 11/05/2015  [facility] and [place] Number:  Herbalist and Address:  The [place]. [facility], [place] [address] [place], [place] [address]      Provider Number: [id number]  Attending Physician Name and Address:  Albertine Patricia, MD  Relative Name and Phone Number:  Gwynneth Aliment [phone]    Current Level of Care: Hospital Recommended Level of Care: [place] Prior Approval Number:    Date Approved/Denied:   PASRR Number:    Discharge Plan: SNF    Current Diagnoses: Patient Active Problem List   Diagnosis Date Noted  . HLD (hyperlipidemia)   . Tobacco abuse   . Hemiparesis affecting left side as late effect of stroke ([place])   . 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 Dysphagia as late effect of cerebrovascular disease   . Left-sided weakness 11/06/2015  . CVA (cerebral infarction) 11/06/2015  . Fall 11/06/2015  . Hypokalemia 11/06/2015  . Thrombocytopenia ([place]) 11/06/2015  . Alcohol abuse 11/06/2015    Orientation RESPIRATION BLADDER Height & Weight    Self, Place  Normal Continent   153 lbs.  BEHAVIORAL SYMPTOMS/MOOD NEUROLOGICAL BOWEL NUTRITION STATUS   (N/A)  (N/A) Continent  (See DC Summary)  AMBULATORY STATUS COMMUNICATION OF NEEDS Skin   Extensive Assist Verbally Normal                       Personal Care Assistance Level of Assistance  Bathing, Feeding, Dressing Bathing Assistance: Maximum assistance Feeding assistance: Independent Dressing Assistance: Maximum assistance     Functional Limitations Info             SPECIAL CARE FACTORS FREQUENCY  PT (By licensed PT)     PT Frequency: 5x/week              Contractures      Additional Factors Info  Code Status, Allergies Code Status Info:  Full Allergies Info: Penicillins           Current Medications (11/08/2015):  This is the current hospital active medication list Current Facility-Administered Medications  Medication Dose Route Frequency Provider Last Rate Last Dose  . 0.9 % NaCl with KCl 20 mEq/ L  infusion   Intravenous Continuous Albertine Patricia, MD 75 mL/hr at 11/07/15 0505    . aspirin tablet 325 mg  325 mg Oral Daily Albertine Patricia, MD   325 mg at 11/08/15 1012  . atorvastatin (LIPITOR) tablet 10 mg  10 mg Oral q1800 Albertine Patricia, MD   10 mg at 11/07/15 1858  . enoxaparin (LOVENOX) injection 40 mg  40 mg Subcutaneous Daily Theressa Millard, MD   40 mg at 11/08/15 1013  . guaiFENesin-dextromethorphan (ROBITUSSIN DM) 100-10 MG/5ML syrup 5 mL  5 mL Oral Q4H PRN Dianne Dun, NP      . senna-docusate (Senokot-S) tablet 1 tablet  1 tablet Oral QHS PRN Theressa Millard, MD         Discharge Medications: Please see discharge summary for a list of discharge medications.  Relevant Imaging Results:  Relevant Lab Results:   Additional Information SSN: [id number]  Benard Halsted, LCSWA

## 2015-11-09 MED ORDER — OLOPATADINE HCL 0.1 % OP SOLN: 1.0000 [drp] | Freq: Two times a day (BID) | OPHTHALMIC | Status: AC

## 2015-11-09 MED ORDER — ATORVASTATIN CALCIUM 10 MG PO TABS: 10.0000 mg | ORAL_TABLET | Freq: Every day | ORAL | Status: DC

## 2015-11-09 MED ORDER — ASPIRIN 325 MG PO TABS: 325.0000 mg | ORAL_TABLET | Freq: Every day | ORAL | Status: AC

## 2015-11-09 MED ORDER — SENNA 8.6 MG PO TABS: 1.0000 | ORAL_TABLET | Freq: Every day | ORAL | Status: DC

## 2015-11-09 NOTE — Progress Notes (Signed)
Occupational Therapy Treatment Patient Details Name: Tyler Pierce MRN: [id number] DOB: [birth year] Today's Date: 11/09/2015    History of present illness 69 y.o. male admitted for L sided weakness and numbness beginning on 11/05/15 and fell as a result of his symptoms. MRI (+) for acute R basal ganglia infarct and remote L basal ganglia infarct.   OT comments  Pt spoke only 1 word at end of session despite max verbal prompting. Pt also required increased assistance during session today. Pt required mod assist for mobility and max verbal cues for pt to remain awake and to attend to tasks. D/C recommendations updated to SNF for psot-acute rehab as anticipate pt will need longer time for therapies.    Follow Up Recommendations  SNF;Supervision/Assistance - 24 hour    Equipment Recommendations  Other (comment) (TBD in next venue)    Recommendations for Other Services      Precautions / Restrictions Precautions Precautions: Fall Restrictions Weight Bearing Restrictions: No       Mobility Bed Mobility Overal bed mobility: Needs Assistance Bed Mobility: Rolling;Sidelying to Sit Rolling: Min assist Sidelying to sit: Min assist       General bed mobility comments: Hand-over-hand cues to reach for bedrails and min assist to roll onto side and to support trunk to come to sitting position.  Transfers Overall transfer level: Needs assistance Equipment used: Rolling walker (2 wheeled) Transfers: Sit to/from Stand Sit to Stand: Mod assist         General transfer comment: Mod assist for boost to stand and to position L hand on RW handle. Mod assist for balance - L knee buckling required physical assist required for controlled descent to chair.    Balance Overall balance assessment: Needs assistance Sitting-balance support: No upper extremity supported;Feet supported Sitting balance-Leahy Scale: Poor Sitting balance - Comments: Initally pt with severe posterior and left lateral  lean but after ~5 minutes of min assist for support pt was able to maintain sitting position independently with 1 UE supported. Postural control: Posterior lean;Left lateral lean Standing balance support: Bilateral upper extremity supported;During functional activity Standing balance-Leahy Scale: Poor                     ADL Overall ADL's : Needs assistance/impaired     Grooming: Wash/dry hands;Wash/dry face;Brushing hair;Minimal assistance;Sitting                   Toilet Transfer: Moderate assistance;Stand-pivot;BSC;RW   Toileting- Clothing Manipulation and Hygiene: Moderate assistance;Sit to/from stand       Functional mobility during ADLs: Moderate assistance;Rolling walker General ADL Comments: Pt with minimal verbal ouput today and required mod verbal cues to complete grooming tasks sitting EOB. Pt required increased assistance (mod assist) for mobility during ADLs compared to last session.       Vision                 Additional Comments: Pt unable to open L eye on OT arrival. Provided pt with warm wet wash cloth to remove debris from eye and pt able to open eye but still noticing redness and pt blinking excessively    Perception     Praxis      Cognition   Behavior During Therapy: Flat affect Overall Cognitive Status: Difficult to assess                       Extremity/Trunk Assessment  Exercises     Shoulder Instructions       General Comments      Pertinent Vitals/ Pain       Pain Assessment: Faces Faces Pain Scale: No hurt  Home Living                                          Prior Functioning/Environment              Frequency Min 3X/week     Progress Toward Goals  OT Goals(current goals can now be found in the care plan section)  Progress towards OT goals: Goals drowngraded-see care plan  Acute Rehab OT Goals Patient Stated Goal: to get stronger and be independent OT  Goal Formulation: With patient Time For Goal Achievement: 11/21/15 Potential to Achieve Goals: Good ADL Goals Pt Will Perform Grooming: with supervision;sitting Pt Will Perform Upper Body Dressing: with min assist;sitting (including gathering clothes) Pt Will Perform Lower Body Dressing: with min assist;sit to/from stand Pt Will Transfer to Toilet: with min assist;ambulating;bedside commode Pt Will Perform Toileting - Clothing Manipulation and hygiene: with min assist;sit to/from stand Additional ADL Goal #1: Pt will independently demonstrate LUE protection strategies  Plan Discharge plan needs to be updated    Co-evaluation                 End of Session Equipment Utilized During Treatment: Gait belt;Rolling walker   Activity Tolerance Patient limited by lethargy   Patient Left in chair;with call bell/phone within reach;with chair alarm set   Nurse Communication Mobility status        Time: 3335-4562 OT Time Calculation (min): 23 min  Charges: OT General Charges $OT Visit: 1 Procedure OT Treatments $Self Care/Home Management : 23-37 mins  Redmond Baseman, OTR/L 11/09/2015, 3:35 PM Pager: [phone]

## 2015-11-09 NOTE — Care Management Important Message (Signed)
Important Message  Patient Details  Name: Tyler Pierce MRN: [id number] Date of Birth: Jul 21, 1946   Medicare Important Message Given:  Yes    Nathen May 11/09/2015, 1:38 PM

## 2015-11-09 NOTE — Progress Notes (Signed)
Speech Language Pathology Treatment: Dysphagia  Patient Details Name: Tyler Pierce MRN: [id number] DOB: 1946/05/20 Today's Date: 11/09/2015 Time: 2122-4825 SLP Time Calculation (min) (ACUTE ONLY): 15 min  Assessment / Plan / Recommendation Clinical Impression  Treatment focused on dysphagia goals. CNA in room feeding patient am meal, with both patient and staff reporting significantly delayed oral transit of dysphagia 2 solids with pocketing and increased coughing as compared to liquids. SLP provided diagnostic and therapeutic po trials of pureed solids and honey thick liquids with improved oral transit of bolus and decreased overt s/s of aspiration. Note that occasionally during MBS, patient with congested cough despite intact airway protection. Intermittent aspiration likely originating from oral deficits (CN VII involvement) with loss of bolus over base of tongue and delayed swallow initiation. Ensuring that patient has swallowed completely before offering/taking next bite/sip decreased overt deficits as well. At this time, given above, will downgrade solids to pureed, continuing honey thick liquids to maximize safety with po intake. SLP will continue to f/u. CNA and RN educated.    HPI HPI: 69 y.o. male who was brought to the ED he had been found on the ground in a shed. He reports that he had sudden onset of left sided weakness and fell onto the ground. He could not get back up due to the weakness. His last know well was a 0500 AM on 11/05/2015. He denies having any preceding headache or dizziness. He denies having chest pain. He has a history of heavy drinking, and reports that his last drink was 2 weeks ago because he had GI virus.      SLP Plan  Continue with current plan of care     Recommendations  Diet recommendations: Dysphagia 1 (puree);Honey-thick liquid Liquids provided via: Teaspoon Medication Administration: Crushed with puree Supervision: Patient able to self  feed;Full supervision/cueing for compensatory strategies Compensations: Slow rate;Small sips/bites Postural Changes and/or Swallow Maneuvers: Seated upright 90 degrees              Oral Care Recommendations: Oral care BID Follow up Recommendations: Inpatient Rehab Plan: Continue with current plan of care  [facility] [place], [place] [phone]  Tyler Pierce Tyler Pierce 11/09/2015, 8:49 AM

## 2015-11-09 NOTE — Care Management Note (Signed)
Case Management Note  Patient Details  Name: Tyler Pierce MRN: [id number] Date of Birth: 1946/08/24  Subjective/Objective:                    Action/Plan: Plan is for patient to discharge to [facility] today. No further needs per CM.   Expected Discharge Date:                  Expected Discharge Plan:     In-House Referral:     Discharge planning Services     Post Acute Care Choice:    Choice offered to:     DME Arranged:    DME Agency:     HH Arranged:    [place] Agency:     Status of Service:     Medicare Important Message Given:  Yes Date Medicare IM Given:    Medicare IM give by:    Date Additional Medicare IM Given:    Additional Medicare Important Message give by:     If discussed at [place] of Stay Meetings, dates discussed:    Additional Comments:  Pollie Friar, RN 11/09/2015, 4:38 PM

## 2015-11-09 NOTE — Progress Notes (Signed)
Physical Therapy Treatment Patient Details Name: Tyler Pierce MRN: [id number] DOB: 12/26/1945 Today's Date: 11/09/2015    History of Present Illness 69 y.o. male admitted for L sided weakness and numbness beginning on 11/05/15 and fell as a result of his symptoms. MRI (+) for acute R basal ganglia infarct and remote L basal ganglia infarct.    PT Comments    Patient slightly improved compared to 12/27, however remains LLE weaker than on evaluation 12/26 and gait much more impaired. Anticipate will need longer time for therapies, therefore now recommending SNF.  Follow Up Recommendations  SNF     Equipment Recommendations  None recommended by PT    Recommendations for Other Services       Precautions / Restrictions Precautions Precautions: Fall Restrictions Weight Bearing Restrictions: No    Mobility  Bed Mobility Overal bed mobility: Needs Assistance Bed Mobility: Supine to Sit     Supine to sit: Min assist     General bed mobility comments: incr time and effort with assist to scoot out to EOB  Transfers Overall transfer level: Needs assistance Equipment used: Rolling walker (2 wheeled) Transfers: Sit to/from Stand Sit to Stand: Mod assist         General transfer comment: assist for balance as moving hands up to RW and then with posterior LOB  Ambulation/Gait Ambulation/Gait assistance: Mod assist;+2 safety/equipment Ambulation Distance (Feet): 17 Feet Assistive device: Rolling walker (2 wheeled) Gait Pattern/deviations: Step-to pattern;Decreased step length - left;Decreased weight shift to right;Shuffle Gait velocity: decr Gait velocity interpretation: <1.8 ft/sec, indicative of risk for recurrent falls General Gait Details: assist weight-shift to his Rt, then he advances LLE very short distance--taking 3 steps with LLE for every 1 step with Rt)    Stairs            Wheelchair Mobility    Modified Rankin (Stroke Patients Only) Modified  Rankin (Stroke Patients Only) Pre-Morbid Rankin Score: No symptoms Modified Rankin: Moderately severe disability     Balance     Sitting balance-Leahy Scale: Fair     Standing balance support: No upper extremity supported Standing balance-Leahy Scale: Poor (once upright for 4 minutes, could maintain midline with ming)                      Cognition Arousal/Alertness: Awake/alert Behavior During Therapy: Flat affect Overall Cognitive Status: Difficult to assess                      Exercises      General Comments        Pertinent Vitals/Pain Pain Assessment: No/denies pain Faces Pain Scale: No hurt    Home Living                      Prior Function            PT Goals (current goals can now be found in the care plan section) Acute Rehab PT Goals Patient Stated Goal: to get stronger and be independent Time For Goal Achievement: 11/14/15 Progress towards PT goals: Not progressing toward goals - comment (LLE incr weakness since eval)    Frequency  Min 3X/week    PT Plan Discharge plan needs to be updated;Frequency needs to be updated    Co-evaluation             End of Session Equipment Utilized During Treatment: Gait belt Activity Tolerance: Patient limited by fatigue Patient left: in  chair;with call bell/phone within reach;with chair alarm set     Time: [phone] PT Time Calculation (min) (ACUTE ONLY): 21 min  Charges:  $Gait Training: 8-22 mins                    G Codes:      Tyler Pierce 2015/11/27, 9:41 AM Pager [phone]

## 2015-11-09 NOTE — Progress Notes (Signed)
I met with pt at bedside as well as discussed with SLP and P.T.Pt will need SNF level rehab at this time. Not at a level to be expected to be independent after a short inpt rehab stay to return to his marginal living situation. I have alerted RN CM and SW that we will sign off at this time. I discussed with pt at bedside and he is in agreement. I have attempted to contact pt's friend, Justice, without success. [phone]

## 2015-11-09 NOTE — Discharge Planning (Signed)
Patient to be discharged to [facility]. Patient's friend, Justice, updated regarding discharge.  Facility: [facility] RN report number: [phone] Transportation: Greig Castilla, [place] [facility]: [id number] Surgical: [phone]

## 2015-11-09 NOTE — Clinical Social Work Placement (Signed)
   CLINICAL SOCIAL WORK PLACEMENT  NOTE  Date:  11/09/2015  Patient Details  Name: Tyler Pierce MRN: [id number] Date of Birth: November 11, 1946  Clinical Social Work is seeking post-discharge placement for this patient at the [place] level of care (*CSW will initial, date and re-position this form in  chart as items are completed):  Yes   Patient/family provided with [place] Work Department's list of facilities offering this level of care within the geographic area requested by the patient (or if unable, by the patient's family).  Yes   Patient/family informed of their freedom to choose among providers that offer the needed level of care, that participate in Medicare, Medicaid or managed care program needed by the patient, have an available bed and are willing to accept the patient.  Yes   Patient/family informed of [place] [facility]'s ownership interest in [facility] and [facility], as well as of the fact that they are under no obligation to receive care at these facilities.  PASRR submitted to EDS on 11/08/15     PASRR number received on 11/08/15     Existing PASRR number confirmed on       FL2 transmitted to all facilities in geographic area requested by pt/family on       FL2 transmitted to all facilities within larger geographic area on       Patient informed that his/her managed care company has contracts with or will negotiate with certain facilities, including the following:        Yes   Patient/family informed of bed offers received.  Patient chooses bed at [facility]     Physician recommends and patient chooses bed at      Patient to be transferred to [facility] on 11/09/15.  Patient to be transferred to facility by [facility]     Patient family notified on 11/09/15 of transfer.  Name of family member notified:  Justice     PHYSICIAN       Additional Comment:     _______________________________________________ Caroline Sauger, LCSW 11/09/2015, 3:01 PM

## 2015-11-09 NOTE — Discharge Summary (Addendum)
PATIENT DETAILS Name: Tyler Pierce Age: 69 y.o. Sex: male Date of Birth: Feb 17, 1946 MRN: [id number]. Admitting Physician: Theressa Millard, MD PCP:No PCP Per Patient  Admit Date: 11/05/2015 Discharge date: 11/09/2015  Recommendations for Outpatient Follow-up:  1. Please obtain speech therapy follow-up at SNF.  2. Please repeat CBC/BMET in 1 week 3. 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 No pertinent past medical history.  DISCHARGE MEDICATIONS: Current Discharge Medication List    START taking these medications   Details  aspirin 325 MG tablet Take 1 tablet (325 mg total) by mouth daily.    atorvastatin (LIPITOR) 10 MG tablet Take 1 tablet (10 mg total) by mouth daily at 6 PM.    olopatadine (PATANOL) 0.1 % ophthalmic solution Place 1 drop into both eyes 2 (two) times daily.    senna (SENOKOT) 8.6 MG TABS tablet Take 1 tablet (8.6 mg total) by mouth at bedtime.        ALLERGIES:   Allergies  Allergen Reactions  . Penicillins     Loss of consciousness  Has patient had a PCN reaction causing immediate rash, facial/tongue/throat swelling, SOB or lightheadedness with hypotension: Yes Has patient had a PCN reaction causing severe rash involving mucus membranes or skin necrosis: No Has patient had a PCN reaction that required hospitalization No Has patient had a PCN reaction occurring within the last 10 years: No If all of the above answers are "NO", then may proceed with Cephalosporin use.    BRIEF  HPI:  See H&P, Labs, Consult and Test reports for all details in brief, patient is a 69 year old male with no significant past medical history who presented with left-sided weakness, MRI brain was positive for right basal ganglia infarct.   CONSULTATIONS:   neurology  PERTINENT RADIOLOGIC STUDIES: Dg Chest 2 View  11/06/2015  CLINICAL DATA:  Acute onset of confusion. Recent fall, with concern for chest injury. Cough. Initial encounter. EXAM: CHEST  2 VIEW COMPARISON:  Chest radiograph performed 10/19/2015 FINDINGS: The lungs are well-aerated and clear. Density overlying the left lung base is thought to reflect a lead. There is no evidence of focal opacification, pleural effusion or pneumothorax. The heart is normal in size; the mediastinal contour is within normal limits. No acute osseous abnormalities are seen. IMPRESSION: No acute cardiopulmonary process seen. No displaced rib fractures identified. Electronically Signed   By: Garald Balding M.D.   On: 11/06/2015 01:26   Dg Chest 2 View  10/19/2015  CLINICAL DATA:  Cough and chest congestion. Shortness breath. Chest pain. Weakness. COPD. EXAM: CHEST  2 VIEW COMPARISON:  10/17/2006 FINDINGS: The heart size and mediastinal contours are within normal limits. Both lungs are clear. Pulmonary hyperinflation again seen, consistent with COPD. No evidence of pneumothorax or pleural effusion. The visualized skeletal structures are unremarkable. IMPRESSION: COPD.  No active cardiopulmonary disease. Electronically Signed   By: Earle Gell M.D.   On: 10/19/2015 17:11   Dg Elbow Complete Left  11/06/2015  CLINICAL DATA:  Acute onset of  confusion. Recent fall, with laceration at the posterior left elbow. Initial encounter. EXAM: LEFT ELBOW - COMPLETE 3+ VIEW COMPARISON:  Left elbow radiographs performed 08/18/2004 FINDINGS: There is no evidence of fracture or dislocation. The visualized plate and screws along the olecranon is grossly intact, without evidence of  loosening. The visualized joint spaces are preserved. No significant joint effusion is identified. The soft tissues are unremarkable in appearance. IMPRESSION: No evidence of fracture or dislocation. Hardware appears grossly intact. Electronically Signed   By: Garald Balding M.D.   On: 11/06/2015 01:23   Ct Head Wo Contrast  11/06/2015  CLINICAL DATA:  Acute onset of left upper and lower extremity weakness and numbness. Status post fall. Left facial numbness. Concern for cervical spine injury. Initial encounter. EXAM: CT HEAD WITHOUT CONTRAST CT CERVICAL SPINE WITHOUT CONTRAST TECHNIQUE: Multidetector CT imaging of the head and cervical spine was performed following the standard protocol without intravenous contrast. Multiplanar CT image reconstructions of the cervical spine were also generated. COMPARISON:  CT of the head and cervical spine performed 10/19/2015 FINDINGS: CT HEAD FINDINGS There is no evidence of acute infarction, mass lesion, or intra- or extra-axial hemorrhage on CT. Prominence of the ventricles and sulci suggests mild cortical volume loss. Mild cerebellar atrophy is noted. Scattered periventricular and subcortical white matter change likely reflects small vessel ischemic microangiopathy. Chronic lacunar infarcts are seen at the basal ganglia bilaterally. The brainstem and fourth ventricle are within normal limits. The cerebral hemispheres demonstrate grossly normal gray-white differentiation. No mass effect or midline shift is seen. There is no evidence of fracture; visualized osseous structures are unremarkable in appearance. The orbits are within normal limits. Mucosal thickening is noted at the maxillary sinuses bilaterally. The remaining paranasal sinuses and mastoid air cells are well-aerated. No significant soft tissue abnormalities are seen. CT CERVICAL SPINE FINDINGS There is no evidence of fracture or subluxation. Vertebral bodies demonstrate normal height and alignment. Minimal  multilevel disc space narrowing is noted along the cervical spine, with scattered endplate subcortical cystic change. Scattered small anterior and posterior disc osteophyte complexes are noted along the cervical spine. Prevertebral soft tissues are within normal limits. The thyroid gland is unremarkable in appearance. Emphysematous change is noted at the lung apices, with minimal associated scarring. No significant soft tissue abnormalities are seen. IMPRESSION: 1. No evidence of traumatic intracranial injury or fracture. 2. No evidence of fracture or subluxation along the cervical spine. 3. Mild cortical volume loss and scattered small vessel ischemic microangiopathy. Chronic lacunar infarcts at the basal ganglia bilaterally. 4. Minimal degenerative change along the cervical spine. 5. Emphysematous change at the lung apices, with minimal associated scarring. Electronically Signed   By: Garald Balding M.D.   On: 11/06/2015 01:34   Ct Head Wo Contrast  10/19/2015  CLINICAL DATA:  Dizziness with fall.  Pain after fall EXAM: CT HEAD WITHOUT CONTRAST CT CERVICAL SPINE WITHOUT CONTRAST TECHNIQUE: Multidetector CT imaging of the head and cervical spine was performed following the standard protocol without intravenous contrast. Multiplanar CT image reconstructions of the cervical spine were also generated. COMPARISON:  None. FINDINGS: CT HEAD FINDINGS There is mild diffuse atrophy with slightly greater parietal lobe atrophy than elsewhere. There is no intracranial mass, hemorrhage, extra-axial fluid collection, or midline shift. There is small vessel disease throughout the centra semiovale bilaterally. There is evidence of a prior focal lacunar infarct in the inferior mid left centrum semiovale. There is evidence of an age uncertain small infarct in the anterior limb of the  left external capsule. The bony calvarium appears intact. The mastoid air cells are clear. There is mucosal thickening in both maxillary antra as  well as in multiple ethmoid air cells, the inferior frontal sinuses bilaterally, and the right sphenoid sinus. No intraorbital lesions are identified. CT CERVICAL SPINE FINDINGS There is no fracture or spondylolisthesis. Prevertebral soft tissues and predental space regions are normal. There is moderate disc space narrowing at C3-4, C4-5, C5-6, and C6-7. There are anterior osteophytes at C3, C4, C5, and C6. There is facet hypertrophy at multiple levels. Exit foraminal narrowing is noted at C3-4, C4-5, C5-6, and C6-7 bilaterally. No disc extrusion or high-grade stenosis. There are scattered foci of coronary artery calcification bilaterally. There is bullous disease in each lung apex. IMPRESSION: CT head: Atrophy with extensive periventricular small vessel disease. 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 No hemorrhage or mass effect. Multifocal paranasal sinus disease. CT cervical spine: No fracture or spondylolisthesis. Multilevel osteoarthritic change. Scattered calcification in the carotid arteries bilaterally. Bullous disease in each lung apex. Electronically Signed   By: Lowella Grip III M.D.   On: 10/19/2015 19:52   Ct Cervical Spine Wo Contrast  11/06/2015  CLINICAL DATA:  Acute onset of left upper and lower extremity weakness and numbness. Status post fall. Left facial numbness. Concern for cervical spine injury. Initial encounter. EXAM: CT HEAD WITHOUT CONTRAST CT CERVICAL SPINE WITHOUT CONTRAST TECHNIQUE: Multidetector CT imaging of the head and cervical spine was performed following the standard protocol without intravenous contrast. Multiplanar CT image reconstructions of the cervical spine were also generated. COMPARISON:  CT of the head and cervical spine performed 10/19/2015 FINDINGS: CT HEAD FINDINGS There is no evidence of acute infarction, mass lesion, or intra- or extra-axial hemorrhage on CT. Prominence of the ventricles and sulci suggests mild  cortical volume loss. Mild cerebellar atrophy is noted. Scattered periventricular and subcortical white matter change likely reflects small vessel ischemic microangiopathy. Chronic lacunar infarcts are seen at the basal ganglia bilaterally. The brainstem and fourth ventricle are within normal limits. The cerebral hemispheres demonstrate grossly normal gray-white differentiation. No mass effect or midline shift is seen. There is no evidence of fracture; visualized osseous structures are unremarkable in appearance. The orbits are within normal limits. Mucosal thickening is noted at the maxillary sinuses bilaterally. The remaining paranasal sinuses and mastoid air cells are well-aerated. No significant soft tissue abnormalities are seen. CT CERVICAL SPINE FINDINGS There is no evidence of fracture or subluxation. Vertebral bodies demonstrate normal height and alignment. Minimal multilevel disc space narrowing is noted along the cervical spine, with scattered endplate subcortical cystic change. Scattered small anterior and posterior disc osteophyte complexes are noted along the cervical spine. Prevertebral soft tissues are within normal limits. The thyroid gland is unremarkable in appearance. Emphysematous change is noted at the lung apices, with minimal associated scarring. No significant soft tissue abnormalities are seen. IMPRESSION: 1. No evidence of traumatic intracranial injury or fracture. 2. No evidence of fracture or subluxation along the cervical spine. 3. Mild cortical volume loss and scattered small vessel ischemic microangiopathy. Chronic lacunar infarcts at the basal ganglia bilaterally. 4. Minimal degenerative change along the cervical spine. 5. Emphysematous change at the lung apices, with minimal associated scarring. Electronically Signed   By: Garald Balding M.D.   On: 11/06/2015 01:34   Ct Cervical Spine Wo Contrast  10/19/2015  CLINICAL DATA:  Dizziness with fall.  Pain after fall EXAM: CT HEAD  WITHOUT CONTRAST CT  CERVICAL SPINE WITHOUT CONTRAST TECHNIQUE: Multidetector CT imaging of the head and cervical spine was performed following the standard protocol without intravenous contrast. Multiplanar CT image reconstructions of the cervical spine were also generated. COMPARISON:  None. FINDINGS: CT HEAD FINDINGS There is mild diffuse atrophy with slightly greater parietal lobe atrophy than elsewhere. There is no intracranial mass, hemorrhage, extra-axial fluid collection, or midline shift. There is small vessel disease throughout the centra semiovale bilaterally. There is evidence of a prior focal lacunar infarct in the inferior mid left centrum semiovale. There is evidence of an age uncertain small infarct in the anterior limb of the left external capsule. The bony calvarium appears intact. The mastoid air cells are clear. There is mucosal thickening in both maxillary antra as well as in multiple ethmoid air cells, the inferior frontal sinuses bilaterally, and the right sphenoid sinus. No intraorbital lesions are identified. CT CERVICAL SPINE FINDINGS There is no fracture or spondylolisthesis. Prevertebral soft tissues and predental space regions are normal. There is moderate disc space narrowing at C3-4, C4-5, C5-6, and C6-7. There are anterior osteophytes at C3, C4, C5, and C6. There is facet hypertrophy at multiple levels. Exit foraminal narrowing is noted at C3-4, C4-5, C5-6, and C6-7 bilaterally. No disc extrusion or high-grade stenosis. There are scattered foci of coronary artery calcification bilaterally. There is bullous disease in each lung apex. IMPRESSION: CT head: Atrophy with extensive periventricular small vessel disease. 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 No hemorrhage or mass effect. Multifocal paranasal sinus disease. CT cervical spine: No fracture or spondylolisthesis. Multilevel osteoarthritic change. Scattered calcification in the carotid  arteries bilaterally. Bullous disease in each lung apex. Electronically Signed   By: Lowella Grip III M.D.   On: 10/19/2015 19:52   Mr Brain Wo Contrast  11/06/2015  CLINICAL DATA:  Sudden onset of left-sided weakness involving the face, upper and lower extremity beginning over 24 hours ago. The patient fell due to weakness. EXAM: MRI HEAD WITHOUT CONTRAST MRA HEAD WITHOUT CONTRAST TECHNIQUE: Multiplanar, multiecho pulse sequences of the brain and surrounding structures were obtained without intravenous contrast. Angiographic images of the head were obtained using MRA technique without contrast. COMPARISON:  CT head without contrast 10/19/2015. FINDINGS: MRI HEAD FINDINGS An acute nonhemorrhagic infarct within the right basal ganglia measures 2.7 cm maximally. There is associated T2 signal change in edema with this infarct. A remote infarct in the left basal ganglia measures 10 mm. There is no acute hemorrhage or mass lesion. Moderate to severe diffuse periventricular and subcortical white matter changes are evident bilaterally. Additional smaller remote lacunar infarcts are present within the basal ganglia bilaterally. The ventricles are proportionate to the degree of atrophy. No significant extra-axial fluid collection is present. The brainstem and cerebellum are unremarkable. The right vertebral artery is hypoplastic. Flow is present in the major intracranial arteries. The globes and orbits are intact. There is near complete opacification of the left maxillary sinus. Prominent mucosal thickening is noted along the floor of the right maxillary sinus with a small fluid level. The paranasal sinuses and mastoid air cells are otherwise clear. The skullbase is within normal limits. Midline sagittal images are unremarkable MRA HEAD FINDINGS The internal carotid arteries are within normal limits from the high cervical segments through the ICA termini bilaterally. The A1 and M1 segments are intact. The anterior  communicating artery is patent. The MCA bifurcations are intact. Moderate medium and small vessel attenuation is symmetric. The left vertebral artery  is the dominant vessel. The right vertebral artery is hypoplastic above the dural margin. The basilar artery is intact. Both posterior cerebral arteries originate from the basilar tip. There is moderate attenuation of distal PCA branch vessels bilaterally. IMPRESSION: 1. 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 2. Remote 10 mm nonhemorrhagic infarct of the left basal ganglia in a similar location. 3. Additional smaller remote lacunar infarcts are present in the basal ganglia bilaterally. 4. Advanced diffuse confluent periventricular and subcortical white matter disease bilaterally compatible with severe microvascular ischemia. 5. The MRA demonstrates diffuse distal medium and small vessel disease without significant proximal stenosis, aneurysm, or branch vessel occlusion. These results will be called to the ordering clinician or representative by the Radiologist Assistant, and communication documented in the PACS or zVision Dashboard. Electronically Signed   By: San Morelle M.D.   On: 11/06/2015 08:30   Dg Knee Complete 4 Views Left  11/06/2015  CLINICAL DATA:  Acute onset of confusion. Status post fall, with anterior left knee laceration. Initial encounter. EXAM: LEFT KNEE - COMPLETE 4+ VIEW COMPARISON:  None. FINDINGS: There is no evidence of fracture or dislocation. The joint spaces are preserved. No significant degenerative change is seen; the patellofemoral joint is grossly unremarkable in appearance. A small knee joint effusion is suggested. The visualized soft tissues are otherwise unremarkable in appearance. IMPRESSION: 1. No evidence of fracture or dislocation. 2. Small knee joint effusion suggested. Electronically Signed   By: Garald Balding M.D.   On: 11/06/2015 01:25   Dg Swallowing Func-speech  Pathology  11/07/2015  Objective Swallowing Evaluation:   MBS Patient Details Name: Tyler Pierce MRN: [id number] Date of Birth: 20-Jan-1946 Pierce's Date: 11/07/2015 Time: SLP Start Time (ACUTE ONLY): 1330-SLP Stop Time (ACUTE ONLY): 1346 SLP Time Calculation (min) (ACUTE ONLY): 16 min Past Medical History: No past medical history on file. Past Surgical History: Past Surgical History Procedure Laterality Date . Finger amputation   HPI: Tyler Pierce is an 69 y.o. male with no reported past medical history presenting with sudden onset of left sided weakness. Out of tPA window. MRI: acute nonhemorrhagic infarct within the right basal ganglia No Data Recorded Assessment / Plan / Recommendation CHL IP CLINICAL IMPRESSIONS 11/07/2015 Therapy Diagnosis Moderate oral phase dysphagia;Moderate pharyngeal phase dysphagia Clinical Impression Patient presents with a moderate oropharyngeal dysphagia characterized by CN VII and XII involvement impacting left sided facial/lingual strength and sensory impairments. Combination of above results in anterior labial loss of bolus, delayed oral transit of bolus, and delayed swallow initiation with deep penetration of liquids thinner than honey thick. Penetration sensed only when reaches the level of the vocal cords and cough response inconsistent to clear. Cues for chin tuck ineffective to increase airway protection. Recommend initiation of conservative diet with close SLP f/u to monitor for diet tolerance and potential to advance.   Impact on safety and function Moderate aspiration risk   CHL IP TREATMENT RECOMMENDATION 11/07/2015 Treatment Recommendations Therapy as outlined in treatment plan below   Prognosis 11/07/2015 Prognosis for Safe Diet Advancement Good Barriers to Reach Goals -- Barriers/Prognosis Comment -- CHL IP DIET RECOMMENDATION 11/07/2015 SLP Diet Recommendations Dysphagia 2 (Fine chop) solids;Honey thick liquids Liquid Administration via Spoon Medication  Administration Crushed with puree Compensations Slow rate;Small sips/bites Postural Changes Seated upright at 90 degrees   CHL IP OTHER RECOMMENDATIONS 11/07/2015 Recommended Consults -- Oral Care Recommendations Oral care BID Other Recommendations Order thickener from pharmacy;Prohibited food (jello, ice cream, thin  soups);Remove water pitcher   CHL IP FOLLOW UP RECOMMENDATIONS 11/07/2015 Follow up Recommendations Inpatient Rehab   CHL IP FREQUENCY AND DURATION 11/07/2015 Speech Therapy Frequency (ACUTE ONLY) min 2x/week Treatment Duration 2 weeks      CHL IP ORAL PHASE 11/07/2015 Oral Phase Impaired Oral - Pudding Teaspoon -- Oral - Pudding Cup -- Oral - Honey Teaspoon Left anterior bolus loss;Lingual/palatal residue;Delayed oral transit Oral - Honey Cup -- Oral - Nectar Teaspoon Left anterior bolus loss;Lingual/palatal residue;Delayed oral transit Oral - Nectar Cup Left anterior bolus loss;Lingual/palatal residue;Delayed oral transit Oral - Nectar Straw -- Oral - Thin Teaspoon -- Oral - Thin Cup Left anterior bolus loss;Lingual/palatal residue;Delayed oral transit Oral - Thin Straw -- Oral - Puree Lingual/palatal residue;Delayed oral transit Oral - Mech Soft Lingual/palatal residue;Delayed oral transit Oral - Regular -- Oral - Multi-Consistency -- Oral - Pill -- Oral Phase - Comment --  CHL IP PHARYNGEAL PHASE 11/07/2015 Pharyngeal Phase Impaired Pharyngeal- Pudding Teaspoon -- Pharyngeal -- Pharyngeal- Pudding Cup -- Pharyngeal -- Pharyngeal- Honey Teaspoon Delayed swallow initiation-pyriform sinuses Pharyngeal -- Pharyngeal- Honey Cup -- Pharyngeal -- Pharyngeal- Nectar Teaspoon Delayed swallow initiation-pyriform sinuses;Penetration/Aspiration before swallow;Penetration/Aspiration during swallow Pharyngeal Material enters airway, CONTACTS cords and not ejected out Pharyngeal- Nectar Cup Delayed swallow initiation-pyriform sinuses;Penetration/Aspiration before swallow;Penetration/Aspiration during swallow  Pharyngeal Material enters airway, CONTACTS cords and not ejected out Pharyngeal- Nectar Straw -- Pharyngeal -- Pharyngeal- Thin Teaspoon -- Pharyngeal -- Pharyngeal- Thin Cup Delayed swallow initiation-pyriform sinuses;Penetration/Aspiration before swallow;Penetration/Aspiration during swallow Pharyngeal Material enters airway, CONTACTS cords and not ejected out Pharyngeal- Thin Straw -- Pharyngeal -- Pharyngeal- Puree Delayed swallow initiation-vallecula Pharyngeal -- Pharyngeal- Mechanical Soft Delayed swallow initiation-vallecula Pharyngeal -- Pharyngeal- Regular -- Pharyngeal -- Pharyngeal- Multi-consistency -- Pharyngeal -- Pharyngeal- Pill -- Pharyngeal -- Pharyngeal Comment --  CHL IP CERVICAL ESOPHAGEAL PHASE 11/07/2015 Cervical Esophageal Phase WFL Pudding Teaspoon -- Pudding Cup -- Honey Teaspoon -- Honey Cup -- Nectar Teaspoon -- Nectar Cup -- Nectar Straw -- Thin Teaspoon -- Thin Cup -- Thin Straw -- Puree -- Mechanical Soft -- Regular -- Multi-consistency -- Pill -- Cervical Esophageal Comment -- Tyler Rainwater MA, CCC-SLP [phone] Tyler Pierce 11/07/2015, 1:58 PM              Mr Tyler Pierce Head/brain Wo Cm  11/06/2015  CLINICAL DATA:  Sudden onset of left-sided weakness involving the face, upper and lower extremity beginning over 24 hours ago. The patient fell due to weakness. EXAM: MRI HEAD WITHOUT CONTRAST MRA HEAD WITHOUT CONTRAST TECHNIQUE: Multiplanar, multiecho pulse sequences of the brain and surrounding structures were obtained without intravenous contrast. Angiographic images of the head were obtained using MRA technique without contrast. COMPARISON:  CT head without contrast 10/19/2015. FINDINGS: MRI HEAD FINDINGS An acute nonhemorrhagic infarct within the right basal ganglia measures 2.7 cm maximally. There is associated T2 signal change in edema with this infarct. A remote infarct in the left basal ganglia measures 10 mm. There is no acute hemorrhage or mass lesion. Moderate to severe  diffuse periventricular and subcortical white matter changes are evident bilaterally. Additional smaller remote lacunar infarcts are present within the basal ganglia bilaterally. The ventricles are proportionate to the degree of atrophy. No significant extra-axial fluid collection is present. The brainstem and cerebellum are unremarkable. The right vertebral artery is hypoplastic. Flow is present in the major intracranial arteries. The globes and orbits are intact. There is near complete opacification of the left maxillary sinus. Prominent mucosal thickening is noted along the floor of the right maxillary  sinus with a small fluid level. The paranasal sinuses and mastoid air cells are otherwise clear. The skullbase is within normal limits. Midline sagittal images are unremarkable MRA HEAD FINDINGS The internal carotid arteries are within normal limits from the high cervical segments through the ICA termini bilaterally. The A1 and M1 segments are intact. The anterior communicating artery is patent. The MCA bifurcations are intact. Moderate medium and small vessel attenuation is symmetric. The left vertebral artery is the dominant vessel. The right vertebral artery is hypoplastic above the dural margin. The basilar artery is intact. Both posterior cerebral arteries originate from the basilar tip. There is moderate attenuation of distal PCA branch vessels bilaterally. IMPRESSION: 1. 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 2. Remote 10 mm nonhemorrhagic infarct of the left basal ganglia in a similar location. 3. Additional smaller remote lacunar infarcts are present in the basal ganglia bilaterally. 4. Advanced diffuse confluent periventricular and subcortical white matter disease bilaterally compatible with severe microvascular ischemia. 5. The MRA demonstrates diffuse distal medium and small vessel disease without significant proximal stenosis, aneurysm, or branch vessel  occlusion. These results will be called to the ordering clinician or representative by the Radiologist Assistant, and communication documented in the PACS or zVision Dashboard. Electronically Signed   By: San Morelle M.D.   On: 11/06/2015 08:30     PERTINENT LAB RESULTS: CBC: No results for input(s): WBC, HGB, HCT, PLT in the last 72 hours. CMET CMP     Component Value Date/Time   NA 138 11/07/2015 0610   K 3.7 11/07/2015 0610   CL 106 11/07/2015 0610   CO2 23 11/07/2015 0610   GLUCOSE 71 11/07/2015 0610   BUN 11 11/07/2015 0610   CREATININE 0.96 11/07/2015 0610   CALCIUM 8.1* 11/07/2015 0610   PROT 5.7* 11/06/2015 0028   ALBUMIN 3.0* 11/06/2015 0028   AST 29 11/06/2015 0028   ALT 25 11/06/2015 0028   ALKPHOS 54 11/06/2015 0028   BILITOT 2.1* 11/06/2015 0028   GFRNONAA >60 11/07/2015 0610   GFRAA >60 11/07/2015 0610    GFR Estimated Creatinine Clearance: 72.6 mL/min (by C-G formula based on Cr of 0.96). No results for input(s): LIPASE, AMYLASE in the last 72 hours. No results for input(s): CKTOTAL, CKMB, CKMBINDEX, TROPONINI in the last 72 hours. Invalid input(s): POCBNP No results for input(s): DDIMER in the last 72 hours. No results for input(s): HGBA1C in the last 72 hours. No results for input(s): CHOL, HDL, LDLCALC, TRIG, CHOLHDL, LDLDIRECT in the last 72 hours. No results for input(s): TSH, T4TOTAL, T3FREE, THYROIDAB in the last 72 hours.  Invalid input(s): FREET3 No results for input(s): VITAMINB12, FOLATE, FERRITIN, TIBC, IRON, RETICCTPCT in the last 72 hours. Coags: No results for input(s): INR in the last 72 hours.  Invalid input(s): PT Microbiology: No results found for this or any previous visit (from the past 240 hour(s)).   BRIEF HOSPITAL COURSE:   Principal Problem: Acute right basal ganglia CVA: Continues to have left-sided weakness and dysphagia. MRI showed nonhemorrhagic infarct of the right basal ganglia, MRA brain showed diffuse distal  medium/small vessel disease. Carotid Doppler negative for significant stenosis, 2-D echocardiogram without any embolic source. Seen by neurology, recommendations are to continue with aspirin and statin on discharge. Seen by physical therapy services-initially recommendations were for CIR, however upon further evaluation, recommendations are for an SNF. Seen by speech therapy, recommendations are for dysphagia 1 diet. This MD spoke with patient, explained risks  of aspiration even with a dysphagia 1 diet, he understood and is accepting all risks and wants to continue with oral intake. Please arrange for speech therapy follow-up while at SNF. Please ensure patient follows up with cardiology for a 30 day event monitor, and with neurology. Post discharge follow-up.   Mild thrombocytopenia: Stable outpatient follow-up and workup.  Tobacco abuse: Counseled   Pierce-DAY OF DISCHARGE:  Subjective:   Tyler Pierce has no headache,no chest abdominal pain,no new weakness tingling or numbness, feels much better wants to go home Pierce.  Objective:   Blood pressure 100/54, pulse 60, temperature 98.3 F (36.8 C), temperature source Axillary, resp. rate 17, height 6' (1.829 m), weight 69.718 kg (153 lb 11.2 oz), SpO2 93 %.  Intake/Output Summary (Last 24 hours) at 11/09/15 1418 Last data filed at 11/09/15 1310  Gross per 24 hour  Intake      0 ml  Output    645 ml  Net   -645 ml   Filed Weights   11/06/15 0349  Weight: 69.718 kg (153 lb 11.2 oz)    Exam Awake Alert, Oriented *3, No new F.N deficits, Normal affect [place].AT,PERRAL Supple Neck,No JVD, No cervical lymphadenopathy appriciated.  Symmetrical Chest wall movement, Good air movement bilaterally, CTAB RRR,No Gallops,Rubs or new Murmurs, No Parasternal Heave +ve B.Sounds, Abd Soft, Non tender, No organomegaly appriciated, No rebound -guarding or rigidity. No Cyanosis, Clubbing or edema, No new Rash or bruise  DISCHARGE  CONDITION: Stable  DISPOSITION: SNF  DISCHARGE INSTRUCTIONS:    Activity:  As tolerated with Full fall precautions use walker/cane & assistance as needed  Get Medicines reviewed and adjusted: Please take all your medications with you for your next visit with your Primary MD  Please request your Primary MD to go over all hospital tests and procedure/radiological results at the follow up, please ask your Primary MD to get all Hospital records sent to his/her office.  If you experience worsening of your admission symptoms, develop shortness of breath, life threatening emergency, suicidal or homicidal thoughts you must seek medical attention immediately by calling 911 or calling your MD immediately  if symptoms less severe.  You must read complete instructions/literature along with all the possible adverse reactions/side effects for all the Medicines you take and that have been prescribed to you. Take any new Medicines after you have completely understood and accpet all the possible adverse reactions/side effects.   Do not drive when taking Pain medications.   Do not take more than prescribed Pain, Sleep and Anxiety Medications  Special Instructions: If you have smoked or chewed Tobacco  in the last 2 yrs please stop smoking, stop any regular Alcohol  and or any Recreational drug use.  Wear Seat belts while driving.  Please note  You were cared for by a hospitalist during your hospital stay. Once you are discharged, your primary care physician will handle any further medical issues. Please note that NO REFILLS for any discharge medications will be authorized once you are discharged, as it is imperative that you return to your primary care physician (or establish a relationship with a primary care physician if you do not have one) for your aftercare needs so that they can reassess your need for medications and monitor your lab values.   Diet recommendation: Heart Healthy diet Diet  recommendations: Dysphagia 1 (puree);Honey-thick liquid Liquids provided via: Teaspoon Medication Administration: Crushed with puree Supervision: Patient able to self feed;Full supervision/cueing for compensatory strategies Compensations: Slow rate;Small sips/bites  Postural Changes and/or Swallow Maneuvers: Seated upright 90 degrees  Discharge Instructions    Ambulatory referral to Neurology    Complete by:  As directed   Pt will follow up with Dr. Erlinda Hong at [facility] in about 2 months. Thanks.     Diet - low sodium heart healthy    Complete by:  As directed   Diet recommendations: Dysphagia 1 (puree);Honey-thick liquid Liquids provided via: Teaspoon Medication Administration: Crushed with puree Supervision: Patient able to self feed;Full supervision/cueing for compensatory strategies Compensations: Slow rate;Small sips/bites Postural Changes and/or Swallow Maneuvers: Seated upright 90 degrees     Increase activity slowly    Complete by:  As directed            Follow-up Information    Follow up with Xu,Jindong, MD. Schedule an appointment as soon as possible for a visit in 2 months.   Specialty:  Neurology   Why:  stroke clinic   Contact information:   [address] [place] [address] [phone]      Total Time spent on discharge equals 45 minutes.  SignedOren Binet 11/09/2015 2:18 PM

## 2015-11-09 NOTE — Progress Notes (Signed)
Patient discharged to [facility] report called IV's removed patient s not alert or orientedd.

## 2015-11-29 ENCOUNTER — Encounter: Payer: Self-pay | Admitting: Cardiovascular Disease

## 2015-12-09 ENCOUNTER — Ambulatory Visit: Payer: Medicare HMO | Admitting: Cardiovascular Disease

## 2015-12-14 ENCOUNTER — Encounter: Payer: Self-pay | Admitting: Cardiovascular Disease

## 2016-01-31 ENCOUNTER — Ambulatory Visit: Payer: Self-pay | Admitting: Neurology

## 2016-02-01 ENCOUNTER — Encounter: Payer: Self-pay | Admitting: Neurology

## 2017-04-10 IMAGING — CT CT HEAD W/O CM
3 of 7 series · 13 of 47 positions shown, 15 images · non-contrast
Comparison: None.

CLINICAL DATA: Dizziness with fall.  Pain after fall

EXAM:
CT HEAD WITHOUT CONTRAST
CT CERVICAL SPINE WITHOUT CONTRAST
TECHNIQUE: Multidetector CT imaging of the head and cervical spine was
performed following the standard protocol without intravenous
contrast. Multiplanar CT image reconstructions of the cervical spine
were also generated.

[Series 302: soft tissue, idose (2) · axial · 0.37mm/px · z∈[+44,+258]mm · 8 of 125 slices shown, 10 images]
[im 9/125  brain]
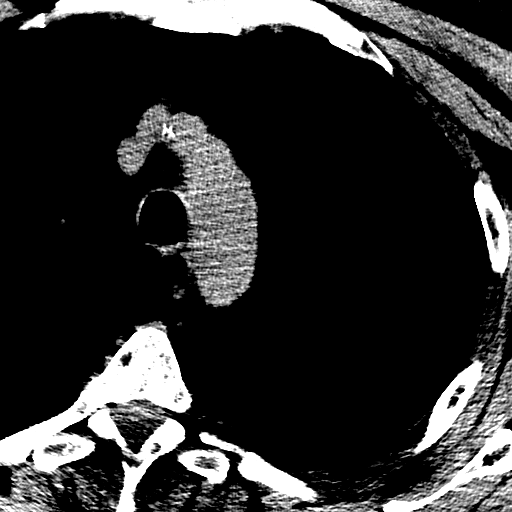
[im 9/125  bone]
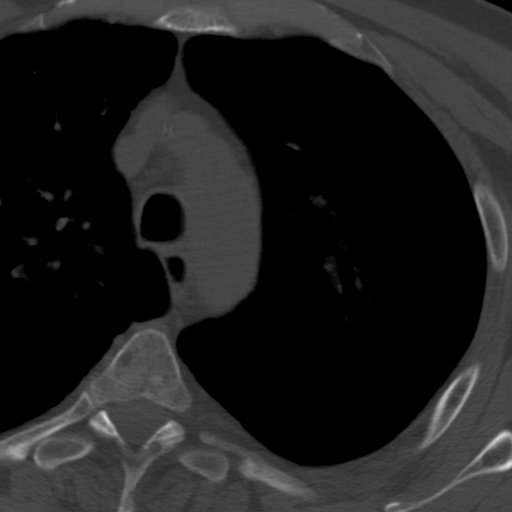
[im 25/125  brain]
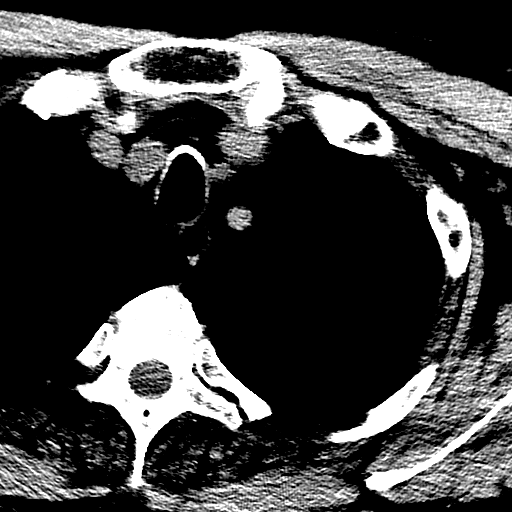
[im 42/125  brain]
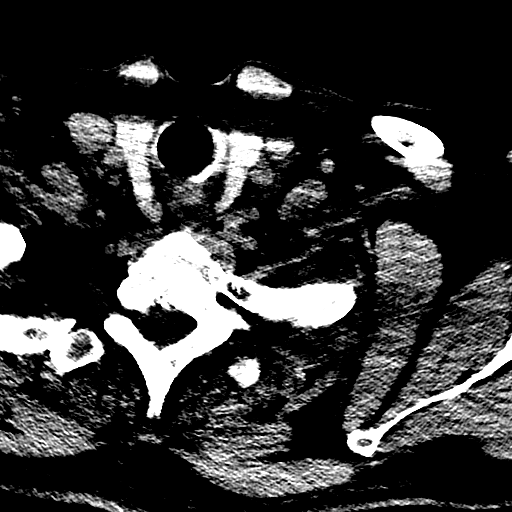
[im 58/125  brain]
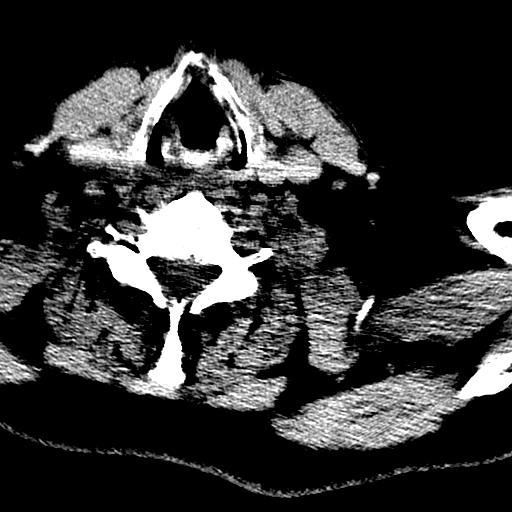
[im 67/125  brain]
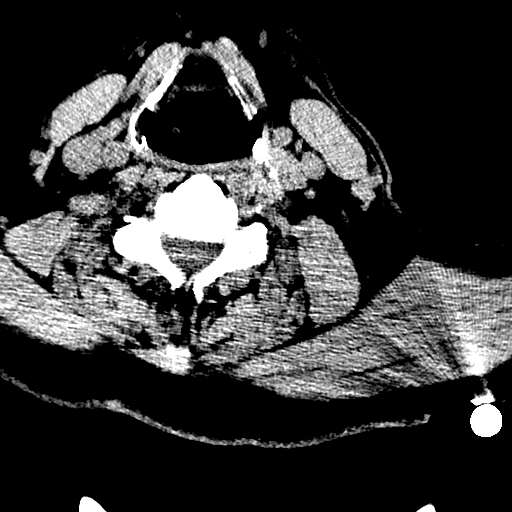
[im 67/125  bone]
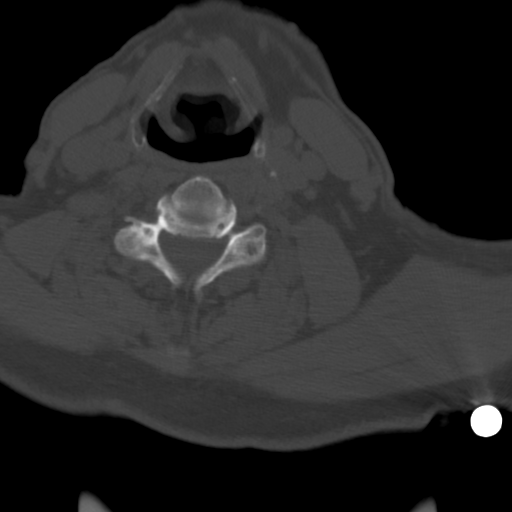
[im 83/125  brain]
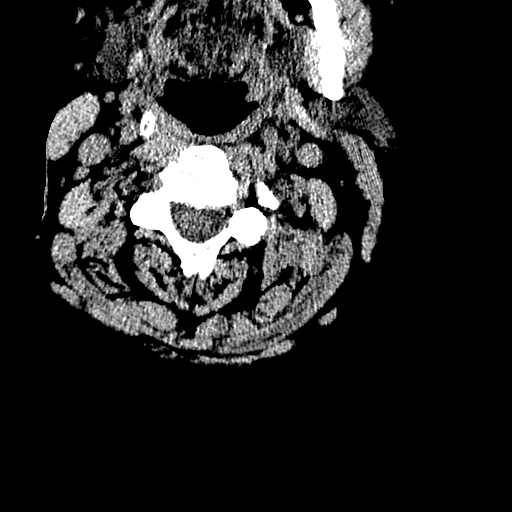
[im 100/125  brain]
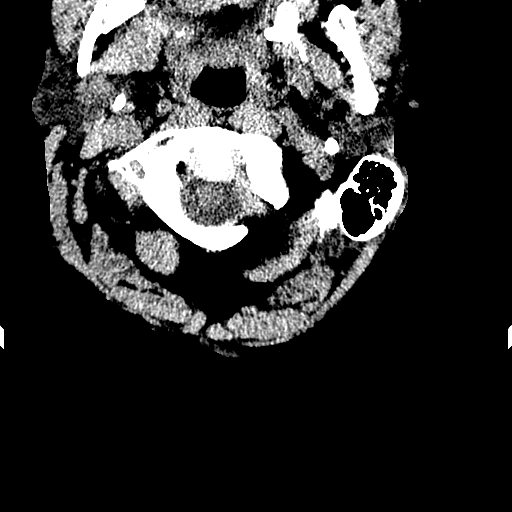
[im 116/125  brain]
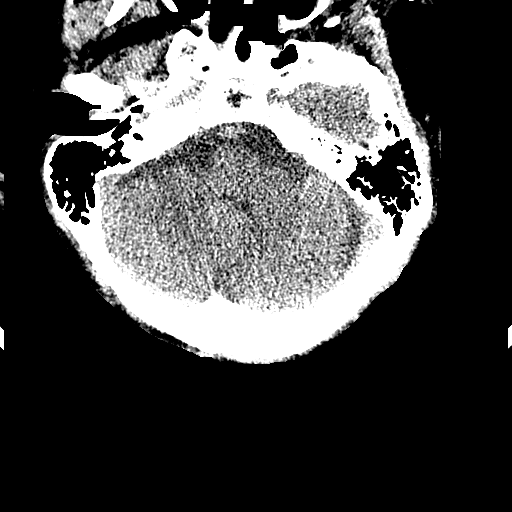

[Series 304: coronal, idose (2) · coronal · 0.34mm/px · 3 of 95 slices shown]
[im 24/95  brain]
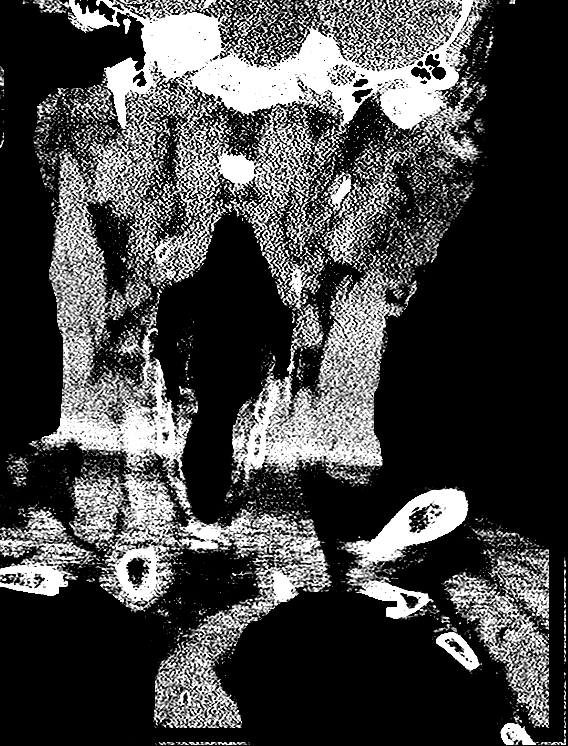
[im 48/95  brain]
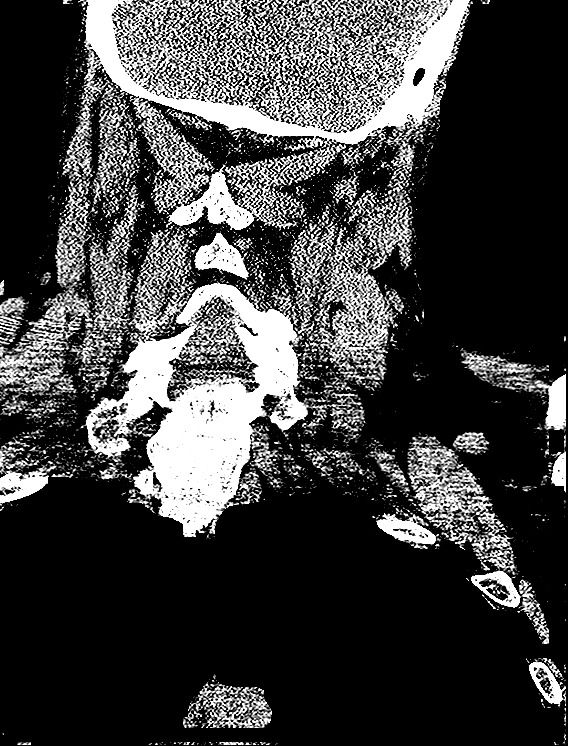
[im 71/95  brain]
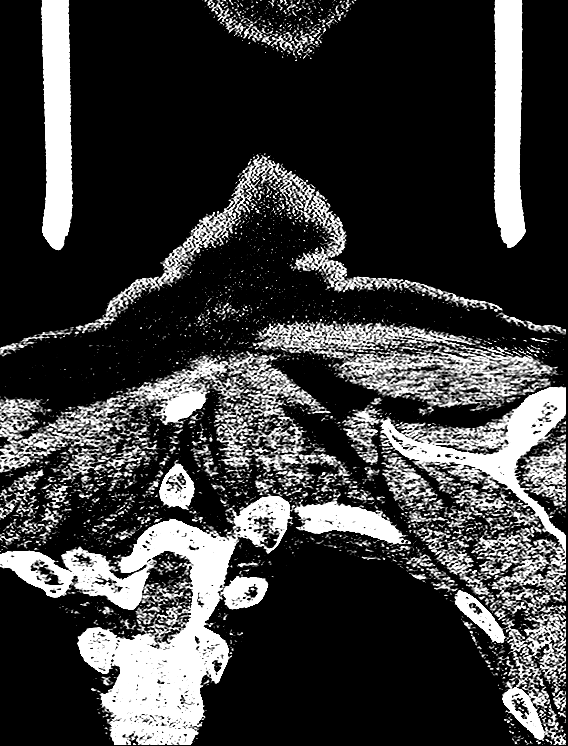

[Series 305: sagittal, idose (2) · sagittal · 0.34mm/px · 2 of 95 slices shown]
[im 32/95  brain]
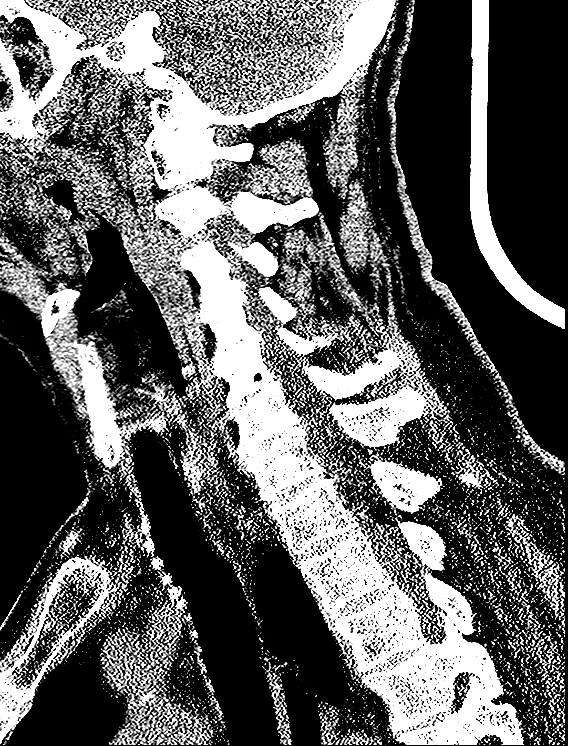
[im 63/95  brain]
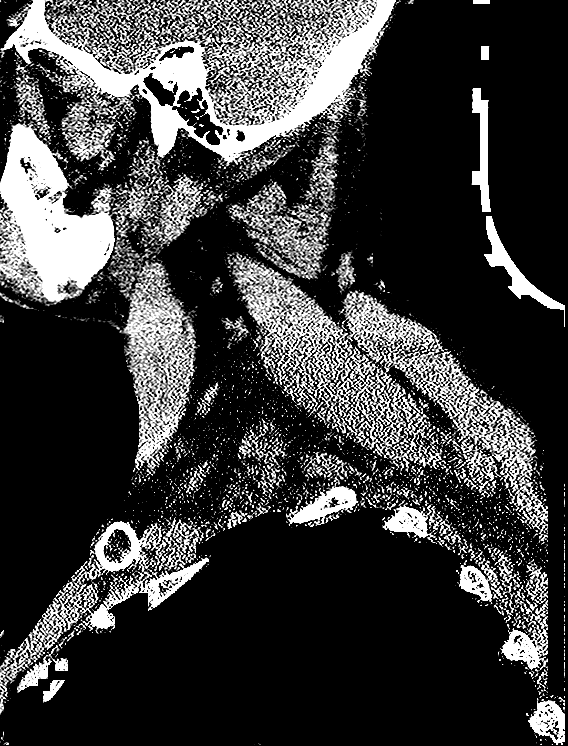

[13 of 47 positions shown; findings below may reference images not displayed]

FINDINGS: CT HEAD FINDINGS

There is mild diffuse atrophy with slightly greater parietal lobe
atrophy than elsewhere. There is no intracranial mass, hemorrhage,
extra-axial fluid collection, or midline shift. There is small
vessel disease throughout the centra semiovale bilaterally. There is
evidence of a prior focal lacunar infarct in the inferior mid left
centrum semiovale. There is evidence of an age uncertain small
infarct in the anterior limb of the left external capsule.

The bony calvarium appears intact. The mastoid air cells are clear.
There is mucosal thickening in both maxillary antra as well as in
multiple ethmoid air cells, the inferior frontal sinuses
bilaterally, and the right sphenoid sinus. No intraorbital lesions
are identified.

CT CERVICAL SPINE FINDINGS

There is no fracture or spondylolisthesis. Prevertebral soft tissues
and predental space regions are normal. There is moderate disc space
narrowing at C3-4, C4-5, C5-6, and C6-7. There are anterior
osteophytes at C3, C4, C5, and C6. There is facet hypertrophy at
multiple levels. Exit foraminal narrowing is noted at C3-4, C4-5,
C5-6, and C6-7 bilaterally. No disc extrusion or high-grade
stenosis. There are scattered foci of coronary artery calcification
bilaterally. There is bullous disease in each lung apex.
IMPRESSION: CT head: Atrophy with extensive periventricular small vessel
disease. Age uncertain small infarct in the anterior limb of the
left external capsule. No hemorrhage or mass effect. Multifocal
paranasal sinus disease.

CT cervical spine: No fracture or spondylolisthesis. Multilevel
osteoarthritic change.

Scattered calcification in the carotid arteries bilaterally. Bullous
disease in each lung apex.

## 2020-05-28 ENCOUNTER — Encounter: Payer: Self-pay | Admitting: Emergency Medicine

## 2020-05-28 ENCOUNTER — Emergency Department
Admission: EM | Admit: 2020-05-28 | Discharge: 2020-05-28 | Disposition: A | Discharge status: Still In Hospital | Payer: Medicare HMO | Attending: Emergency Medicine | Admitting: Emergency Medicine

## 2020-05-28 ENCOUNTER — Other Ambulatory Visit: Payer: Self-pay

## 2020-05-28 ENCOUNTER — Emergency Department: Payer: Medicare HMO

## 2020-05-28 DIAGNOSIS — Z20822 Contact with and (suspected) exposure to covid-19: Secondary | ICD-10-CM | POA: Diagnosis not present

## 2020-05-28 DIAGNOSIS — Z87891 Personal history of nicotine dependence: Secondary | ICD-10-CM | POA: Insufficient documentation

## 2020-05-28 DIAGNOSIS — D496 Neoplasm of unspecified behavior of brain: Secondary | ICD-10-CM

## 2020-05-28 DIAGNOSIS — J449 Chronic obstructive pulmonary disease, unspecified: Secondary | ICD-10-CM | POA: Diagnosis not present

## 2020-05-28 DIAGNOSIS — Z7982 Long term (current) use of aspirin: Secondary | ICD-10-CM | POA: Insufficient documentation

## 2020-05-28 DIAGNOSIS — R531 Weakness: Secondary | ICD-10-CM | POA: Diagnosis present

## 2020-05-28 DIAGNOSIS — R4701 Aphasia: Secondary | ICD-10-CM

## 2020-05-28 LAB — HEPATIC FUNCTION PANEL
ALT: 22 U/L (ref 0–44)
AST: 24 U/L (ref 15–41)
Albumin: 3.4 g/dL — ABNORMAL LOW (ref 3.5–5.0)
Alkaline Phosphatase: 63 U/L (ref 38–126)
Bilirubin, Direct: 0.1 mg/dL (ref 0.0–0.2)
Indirect Bilirubin: 0.7 mg/dL (ref 0.3–0.9)
Total Bilirubin: 0.8 mg/dL (ref 0.3–1.2)
Total Protein: 7.1 g/dL (ref 6.5–8.1)

## 2020-05-28 LAB — BASIC METABOLIC PANEL
Anion gap: 9 (ref 5–15)
BUN: 31 mg/dL — ABNORMAL HIGH (ref 8–23)
CO2: 26 mmol/L (ref 22–32)
Calcium: 8.8 mg/dL — ABNORMAL LOW (ref 8.9–10.3)
Chloride: 103 mmol/L (ref 98–111)
Creatinine, Ser: 0.99 mg/dL (ref 0.61–1.24)
GFR calc Af Amer: >60 mL/min (ref >60)
GFR calc non Af Amer: >60 mL/min (ref >60)
Glucose, Bld: 112 mg/dL — ABNORMAL HIGH (ref 70–99)
Potassium: 4 mmol/L (ref 3.5–5.1)
Sodium: 138 mmol/L (ref 135–145)

## 2020-05-28 LAB — CBC
HCT: 42.6 % (ref 39.0–52.0)
Hemoglobin: 14.4 g/dL (ref 13.0–17.0)
MCH: 32.7 pg (ref 26.0–34.0)
MCHC: 33.8 g/dL (ref 30.0–36.0)
MCV: 96.8 fL (ref 80.0–100.0)
Platelets: 275 10*3/uL (ref 150–400)
RBC: 4.4 MIL/uL (ref 4.22–5.81)
RDW: 14.2 % (ref 11.5–15.5)
WBC: 5.8 10*3/uL (ref 4.0–10.5)
nRBC: 0 % (ref 0.0–0.2)

## 2020-05-28 LAB — CK: Total CK: 102 U/L (ref 49–397)

## 2020-05-28 LAB — GLUCOSE, CAPILLARY: Glucose-Capillary: 102 mg/dL — ABNORMAL HIGH (ref 70–99)

## 2020-05-28 LAB — SARS CORONAVIRUS 2 BY RT PCR (HOSPITAL ORDER, PERFORMED IN ~~LOC~~ HOSPITAL LAB): SARS Coronavirus 2: NEGATIVE

## 2020-05-28 LAB — AMMONIA: Ammonia: 18 umol/L (ref 9–35)

## 2020-05-28 LAB — LACTIC ACID, PLASMA: Lactic Acid, Venous: 1.3 mmol/L (ref 0.5–1.9)

## 2020-05-28 LAB — LIPASE, BLOOD: Lipase: 46 U/L (ref 11–51)

## 2020-05-28 MED ORDER — SODIUM CHLORIDE 0.9 % IV BOLUS
1000.0000 mL | Freq: Once | INTRAVENOUS | Status: AC
  Administered 2020-05-28: 1000 mL via INTRAVENOUS

## 2020-05-28 NOTE — ED Notes (Addendum)
Scattered rash on pt's buttocks, skin appears to be pink and healing. No ulceration noted on pt's sacrum. Pt is AOX4- answers questions slowly. Pt states this is normal for him- is residual from previous stroke.

## 2020-05-28 NOTE — ED Triage Notes (Signed)
FIRST NURSE NOTE:  Pt arrived via POV with friend Justice, states pt has lived on her property for over 20 years, he has no family and she tries to regularly check on him.  Per Justice, pt has not been able to care for himself and has no control over his bowels and is very weak. She has he also has sore on his bottom.  She spoke with a facility called [facility] who said the patient could come there once he was evaluated in the ED.  Pt not communicating much in lobby.   Justice unable to stay with patient during triage due to have multiple children to care for, but states she is available by phone and was given the phone number to the ED Lobby Screener to call for updates.   Also informed Justice that sometimes if the patient does not meet criteria that patients cannot always be placed to rehab facility from the ED.

## 2020-05-28 NOTE — ED Notes (Signed)
Pt unable to give written consent, no information for next of kin available. Pt informed of transfer- verbal consent obtained from the patient.

## 2020-05-28 NOTE — ED Provider Notes (Signed)
[facility] Emergency Department Provider Note  ____________________________________________  Time seen: Approximately 4:50 PM  I have reviewed the triage vital signs and the nursing notes.   HISTORY  Chief Complaint Weakness    Level 5 Caveat: Portions of the History and Physical including HPI and review of systems are unable to be completely obtained due to patient being nonverbal  HPI Tyler Pierce is a 74 y.o. male with a history of alcohol abuse, COPD, prior CVA with residual left-sided weakness who was brought to the ED for evaluation by his friend/landlord who reports that patient has not been able to care for himself and has had bowel incontinence and generalized weakness.  Patient is only able to shake his head to yes/no questions, no verbal responses.  He denies pain, indicates that he feels at his baseline state of health but agrees that he is very weak and unable to stand or walk or take care of himself.  Denies falls or injuries.    Past Medical History:  Diagnosis Date  . Alcohol abuse 11/06/2015  . COPD (chronic obstructive pulmonary disease) ([place])   . CVA (cerebral infarction) 11/06/2015  . 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 Dysphagia as late effect of cerebrovascular disease   . Fall 11/06/2015  . Hemiparesis affecting left side as late effect of stroke ([place])   . HLD (hyperlipidemia)   . Hypokalemia 11/06/2015  . Left-sided weakness   . Thrombocytopenia ([place]) 11/06/2015  . Tobacco abuse      Patient Active Problem List   Diagnosis Date Noted  . HLD (hyperlipidemia)   . Tobacco abuse   . Hemiparesis affecting left side as late effect of stroke ([place])   . 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 Dysphagia as late effect of cerebrovascular disease   . Left-sided weakness 11/06/2015  . CVA (cerebral infarction) 11/06/2015  . Fall 11/06/2015  . Hypokalemia 11/06/2015  . Thrombocytopenia ([place])  11/06/2015  . Alcohol abuse 11/06/2015     Past Surgical History:  Procedure Laterality Date  . FINGER AMPUTATION Left   . FOOT SURGERY Right      Prior to Admission medications   Medication Sig Start Date End Date Taking? Authorizing Provider  aspirin 325 MG tablet Take 1 tablet (325 mg total) by mouth daily. 11/09/15   Ghimire, Henreitta Leber, MD  atorvastatin (LIPITOR) 10 MG tablet Take 1 tablet (10 mg total) by mouth daily at 6 PM. 11/09/15   Ghimire, Henreitta Leber, MD  olopatadine (PATANOL) 0.1 % ophthalmic solution Place 1 drop into both eyes 2 (two) times daily. 11/09/15   Ghimire, Henreitta Leber, MD  senna (SENOKOT) 8.6 MG TABS tablet Take 1 tablet (8.6 mg total) by mouth at bedtime. 11/09/15   GhimireHenreitta Leber, MD     Allergies Penicillins   Family History  Problem Relation Age of Onset  . Other Other        FX HX OF COLON CANCER  . Other Other        FX HX OF COLON POLYPS    Social History Social History   Tobacco Use  . Smoking status: Former Smoker    Packs/day: 1.00    Years: 50.00    Pack years: 50.00    Types: Cigarettes    Quit date: 10/23/2015    Years since quitting: 4.6  . Smokeless tobacco: Never Used  Vaping Use  . Vaping Use: Never used  Substance Use Topics  .  Alcohol use: Yes  . Drug use: No    Review of Systems Level 5 Caveat: Portions of the History and Physical including HPI and review of systems are unable to be completely obtained due to patient being a poor historian   Constitutional:   No known fever.  ENT:   No rhinorrhea. Cardiovascular:   No chest pain or syncope. Respiratory:   No dyspnea or cough. Gastrointestinal:   Negative for abdominal pain, vomiting and diarrhea.  Musculoskeletal:   Negative for focal pain or swelling ____________________________________________   PHYSICAL EXAM:  VITAL SIGNS: ED Triage Vitals  Enc Vitals Group     BP 05/28/20 1527 92/65     Pulse Rate 05/28/20 1527 75     Resp 05/28/20 1527 16      Temp 05/28/20 1527 98 F (36.7 C)     Temp Source 05/28/20 1527 Oral     SpO2 05/28/20 1527 98 %     Weight 05/28/20 1528 150 lb (68 kg)     Height 05/28/20 1528 5\' 11"  (1.803 m)     Head Circumference --      Peak Flow --      Pain Score 05/28/20 1528 0     Pain Loc --      Pain Edu? --      Excl. in [place]? --     Vital signs reviewed, nursing assessments reviewed.   Constitutional:   Alert and oriented. Non-toxic, calm. gaunt appearance. Eyes:   Conjunctivae are normal. EOMI. PERRL. ENT      Head:   Normocephalic and atraumatic.      Nose:   No congestion/rhinnorhea.       Mouth/Throat:   MMM, no pharyngeal erythema. No peritonsillar mass.       Neck:   No meningismus. Full ROM. Hematological/Lymphatic/Immunilogical:   No cervical lymphadenopathy. Cardiovascular:   RRR. Symmetric bilateral radial and DP pulses.  No murmurs. Cap refill less than 2 seconds. Respiratory:   Normal respiratory effort without tachypnea/retractions. Breath sounds are clear and equal bilaterally. No wheezes/rales/rhonchi. Gastrointestinal:   Soft and nontender. Non distended. There is no CVA tenderness.  No rebound, rigidity, or guarding. Musculoskeletal:   Normal range of motion in all extremities. No joint effusions.  No lower extremity tenderness.  No edema. Neurologic:   Normal speech and language.  Motor grossly intact. No acute focal neurologic deficits are appreciated.  Skin:    Skin is warm, dry and intact.  Patient is wearing an adult diaper, and has some diaper dermatitis over the sacrum, currently being treated with baby powder.  No petechiae, purpura, or bullae.  ____________________________________________    LABS (pertinent positives/negatives) (all labs ordered are listed, but only abnormal results are displayed) Labs Reviewed  BASIC METABOLIC PANEL - Abnormal; Notable for the following components:      Result Value   Glucose, Bld 112 (*)    BUN 31 (*)    Calcium 8.8 (*)    All  other components within normal limits  GLUCOSE, CAPILLARY - Abnormal; Notable for the following components:   Glucose-Capillary 102 (*)    All other components within normal limits  HEPATIC FUNCTION PANEL - Abnormal; Notable for the following components:   Albumin 3.4 (*)    All other components within normal limits  SARS CORONAVIRUS 2 BY RT PCR (HOSPITAL ORDER, [place] [facility])  CBC  LACTIC ACID, PLASMA  LIPASE, BLOOD  AMMONIA  CK  URINALYSIS, COMPLETE (UACMP) WITH  MICROSCOPIC  CBG MONITORING, ED   ____________________________________________   EKG  Interpreted by me Sinus rhythm rate of 61, normal axis and intervals.  Normal QRS ST segments and T waves.  ____________________________________________    RADIOLOGY  CT HEAD WO CONTRAST  Result Date: 05/28/2020 CLINICAL DATA:  Altered mental status. EXAM: CT HEAD WITHOUT CONTRAST CT CERVICAL SPINE WITHOUT CONTRAST TECHNIQUE: Multidetector CT imaging of the head and cervical spine was performed following the standard protocol without intravenous contrast. Multiplanar CT image reconstructions of the cervical spine were also generated. COMPARISON:  Head and cervical spine CT and brain MR dated 11/06/2015. FINDINGS: CT HEAD FINDINGS Brain: Interval large mass in the periphery of the left frontal lobe with peripheral high density and central low density, measuring approximately 9.6 x 8.6 x4.5 cm on image number 13 series 3 and 8.6 cm in cephalo caudal dimension on coronal image number 21 series 4. This appears to be extra-axial in location with associated vasogenic edema in the adjacent white matter and significant midline shift to the right, measuring 1.5 cm. This is compressing the left lateral ventricle and 3rd ventricle without dilatation of the temporal horn of the left lateral ventricle. No dilatation of the right lateral ventricle. Extensive patchy white matter low density is again demonstrated elsewhere in both  cerebral hemispheres. This has progressed. Interval old right basal ganglia infarct. No CT evidence of acute infarction. No intracranial hemorrhage. Vascular: No hyperdense vessel or unexpected calcification. Skull: Normal. Negative for fracture or focal lesion. Sinuses/Orbits: No acute finding. Other: None. CT CERVICAL SPINE FINDINGS Alignment: Straightening of the normal cervical lordosis. Mild levoconvex cervicothoracic scoliosis. No subluxations. Skull base and vertebrae: No acute fracture. No primary bone lesion or focal pathologic process. Soft tissues and spinal canal: No prevertebral fluid or swelling. No visible canal hematoma. Disc levels:  Multilevel degenerative changes. Upper chest: Bullous changes at both lung apices. Other: Bilateral carotid artery calcifications. IMPRESSION: 1. Interval large extra-axial left frontal lobe mass with peripheral high density and central necrosis, most likely representing a malignant meningioma. There is significant associated mass effect with midline shift to the right, measuring 1.5 cm. Further evaluation with pre and postcontrast magnetic resonance imaging of the brain is recommended. 2. Progressive extensive chronic small vessel white matter ischemic changes in both cerebral hemispheres with an interval old right basal ganglia infarct and grossly stable atrophy. 3. No cervical spine fracture or subluxation. 4. Multilevel cervical spine degenerative changes. 5. Bilateral carotid artery atheromatous calcifications. 6. Bullous changes at both lung apices. These results were called by telephone at the time of interpretation on 05/28/2020 at 6:27 pm to provider Keandria Berrocal , who verbally acknowledged these results. Electronically Signed   By: Claudie Revering M.D.   On: 05/28/2020 18:39   CT CERVICAL SPINE WO CONTRAST  Result Date: 05/28/2020 CLINICAL DATA:  Altered mental status. EXAM: CT HEAD WITHOUT CONTRAST CT CERVICAL SPINE WITHOUT CONTRAST TECHNIQUE:  Multidetector CT imaging of the head and cervical spine was performed following the standard protocol without intravenous contrast. Multiplanar CT image reconstructions of the cervical spine were also generated. COMPARISON:  Head and cervical spine CT and brain MR dated 11/06/2015. FINDINGS: CT HEAD FINDINGS Brain: Interval large mass in the periphery of the left frontal lobe with peripheral high density and central low density, measuring approximately 9.6 x 8.6 x4.5 cm on image number 13 series 3 and 8.6 cm in cephalo caudal dimension on coronal image number 21 series 4. This appears to be extra-axial in location  with associated vasogenic edema in the adjacent white matter and significant midline shift to the right, measuring 1.5 cm. This is compressing the left lateral ventricle and 3rd ventricle without dilatation of the temporal horn of the left lateral ventricle. No dilatation of the right lateral ventricle. Extensive patchy white matter low density is again demonstrated elsewhere in both cerebral hemispheres. This has progressed. Interval old right basal ganglia infarct. No CT evidence of acute infarction. No intracranial hemorrhage. Vascular: No hyperdense vessel or unexpected calcification. Skull: Normal. Negative for fracture or focal lesion. Sinuses/Orbits: No acute finding. Other: None. CT CERVICAL SPINE FINDINGS Alignment: Straightening of the normal cervical lordosis. Mild levoconvex cervicothoracic scoliosis. No subluxations. Skull base and vertebrae: No acute fracture. No primary bone lesion or focal pathologic process. Soft tissues and spinal canal: No prevertebral fluid or swelling. No visible canal hematoma. Disc levels:  Multilevel degenerative changes. Upper chest: Bullous changes at both lung apices. Other: Bilateral carotid artery calcifications. IMPRESSION: 1. Interval large extra-axial left frontal lobe mass with peripheral high density and central necrosis, most likely representing a  malignant meningioma. There is significant associated mass effect with midline shift to the right, measuring 1.5 cm. Further evaluation with pre and postcontrast magnetic resonance imaging of the brain is recommended. 2. Progressive extensive chronic small vessel white matter ischemic changes in both cerebral hemispheres with an interval old right basal ganglia infarct and grossly stable atrophy. 3. No cervical spine fracture or subluxation. 4. Multilevel cervical spine degenerative changes. 5. Bilateral carotid artery atheromatous calcifications. 6. Bullous changes at both lung apices. These results were called by telephone at the time of interpretation on 05/28/2020 at 6:27 pm to provider Labradford Schnitker , who verbally acknowledged these results. Electronically Signed   By: Claudie Revering M.D.   On: 05/28/2020 18:39   DG Chest Portable 1 View  Result Date: 05/28/2020 CLINICAL DATA:  Weakness, confusion EXAM: PORTABLE CHEST 1 VIEW COMPARISON:  Radiograph 11/06/2015 FINDINGS: Chronically coarsened interstitial and bronchitic changes are similar to priors. Lungs are well aerated and otherwise clear. No pneumothorax or effusion. The aorta is calcified. The remaining cardiomediastinal contours are unremarkable. No acute osseous or soft tissue abnormality. Degenerative changes are present in the imaged spine and shoulders. Telemetry leads overlie the chest. IMPRESSION: 1. No acute cardiopulmonary abnormality. 2. Chronically coarsened interstitial and bronchitic changes. 3.  Aortic Atherosclerosis (ICD10-I70.0). Electronically Signed   By: Lovena Le M.D.   On: 05/28/2020 16:49    ____________________________________________   PROCEDURES Procedures  ____________________________________________  DIFFERENTIAL DIAGNOSIS   Stroke, intracranial hemorrhage, aspiration pneumonia, dehydration, electrolyte abnormality, hepatic encephalopathy, malnutrition, deconditioning  CLINICAL IMPRESSION / ASSESSMENT AND  PLAN / ED COURSE  Medications ordered in the ED: Medications  sodium chloride 0.9 % bolus 1,000 mL (0 mLs Intravenous Stopped 05/28/20 1826)    Pertinent labs & imaging results that were available during my care of the patient were reviewed by me and considered in my medical decision making (see chart for details).   Tyler Pierce was evaluated in Emergency Department on 05/28/2020 for the symptoms described in the history of present illness. He was evaluated in the context of the global COVID-19 pandemic, which necessitated consideration that the patient might be at risk for infection with the SARS-CoV-2 virus that causes COVID-19. Institutional protocols and algorithms that pertain to the evaluation of patients at risk for COVID-19 are in a state of rapid change based on information released by regulatory bodies including the CDC and federal and state organizations.  These policies and algorithms were followed during the patient's care in the ED.   Nonverbal patient brought to ED due to worsen generalized weakness.  Distress, exam is nonfocal.  Will perform a broad work-up due to the limited ability to obtain history from the patient.  Clinical Course as of May 28 1936  Sat May 28, 2020  1824 CT HEAD WO CONTRAST [OW]  1838 CT scan discussed with radiologist who notes a large meningioma of the left frontal lobe, new since December 2016, causing 1.5 cm of midline shift.  This explains the patient's aphasia.  Patient informed of results, seems to understand, nods his agreement with transfer and further evaluation.   [PS]  1846 Case d/w [facility] transfer center [place]. Will await callback from nsgy   [PS]  1934 Received callback from [place], accepted ED to ED by ns[facility] Dr. Faustino Congress. Stable for ground transport   [PS]    Clinical Course User Index [OW] Celene Kras, Student-PA [PS] Carrie Mew, MD     ____________________________________________   FINAL CLINICAL IMPRESSION(S) / ED  DIAGNOSES    Final diagnoses:  Neoplasm of brain causing mass effect on adjacent structures [facility])  Aphasia     ED Discharge Orders    None      Portions of this note were generated with dragon dictation software. Dictation errors may occur despite best attempts at proofreading.   Carrie Mew, MD 05/28/20 [phone]

## 2020-05-28 NOTE — ED Notes (Signed)
Called [facility] for transport to [facility] ED  [phone]

## 2020-05-28 NOTE — ED Notes (Signed)
[facility] transfer center called  patient accepted by Neuro surgery to [facility] ED

## 2020-12-26 ENCOUNTER — Encounter: Payer: Self-pay | Admitting: Radiology

## 2020-12-26 ENCOUNTER — Other Ambulatory Visit: Payer: Self-pay

## 2020-12-26 ENCOUNTER — Emergency Department: Payer: Medicare HMO

## 2020-12-26 ENCOUNTER — Inpatient Hospital Stay
Admission: EM | Admit: 2020-12-26 | Discharge: 2021-01-10 | DRG: 871 | Disposition: E | Payer: Medicare HMO | Source: Skilled Nursing Facility | Attending: Internal Medicine | Admitting: Internal Medicine

## 2020-12-26 DIAGNOSIS — E46 Unspecified protein-calorie malnutrition: Secondary | ICD-10-CM | POA: Diagnosis present

## 2020-12-26 DIAGNOSIS — R64 Cachexia: Secondary | ICD-10-CM | POA: Diagnosis present

## 2020-12-26 DIAGNOSIS — R6520 Severe sepsis without septic shock: Secondary | ICD-10-CM | POA: Diagnosis present

## 2020-12-26 DIAGNOSIS — G9341 Metabolic encephalopathy: Secondary | ICD-10-CM | POA: Diagnosis present

## 2020-12-26 DIAGNOSIS — R54 Age-related physical debility: Secondary | ICD-10-CM | POA: Diagnosis present

## 2020-12-26 DIAGNOSIS — E870 Hyperosmolality and hypernatremia: Secondary | ICD-10-CM | POA: Diagnosis present

## 2020-12-26 DIAGNOSIS — C7931 Secondary malignant neoplasm of brain: Secondary | ICD-10-CM | POA: Diagnosis present

## 2020-12-26 DIAGNOSIS — I10 Essential (primary) hypertension: Secondary | ICD-10-CM | POA: Diagnosis present

## 2020-12-26 DIAGNOSIS — L89153 Pressure ulcer of sacral region, stage 3: Secondary | ICD-10-CM | POA: Diagnosis present

## 2020-12-26 DIAGNOSIS — Z515 Encounter for palliative care: Secondary | ICD-10-CM | POA: Diagnosis not present

## 2020-12-26 DIAGNOSIS — C3491 Malignant neoplasm of unspecified part of right bronchus or lung: Secondary | ICD-10-CM | POA: Diagnosis present

## 2020-12-26 DIAGNOSIS — R1310 Dysphagia, unspecified: Secondary | ICD-10-CM | POA: Diagnosis present

## 2020-12-26 DIAGNOSIS — I471 Supraventricular tachycardia: Secondary | ICD-10-CM | POA: Diagnosis present

## 2020-12-26 DIAGNOSIS — Z85038 Personal history of other malignant neoplasm of large intestine: Secondary | ICD-10-CM

## 2020-12-26 DIAGNOSIS — R531 Weakness: Secondary | ICD-10-CM

## 2020-12-26 DIAGNOSIS — D696 Thrombocytopenia, unspecified: Secondary | ICD-10-CM | POA: Diagnosis present

## 2020-12-26 DIAGNOSIS — N179 Acute kidney failure, unspecified: Secondary | ICD-10-CM | POA: Diagnosis present

## 2020-12-26 DIAGNOSIS — Z8673 Personal history of transient ischemic attack (TIA), and cerebral infarction without residual deficits: Secondary | ICD-10-CM | POA: Diagnosis not present

## 2020-12-26 DIAGNOSIS — G935 Compression of brain: Secondary | ICD-10-CM | POA: Diagnosis present

## 2020-12-26 DIAGNOSIS — J440 Chronic obstructive pulmonary disease with acute lower respiratory infection: Secondary | ICD-10-CM | POA: Diagnosis present

## 2020-12-26 DIAGNOSIS — E785 Hyperlipidemia, unspecified: Secondary | ICD-10-CM | POA: Diagnosis present

## 2020-12-26 DIAGNOSIS — E876 Hypokalemia: Secondary | ICD-10-CM | POA: Diagnosis not present

## 2020-12-26 DIAGNOSIS — J690 Pneumonitis due to inhalation of food and vomit: Secondary | ICD-10-CM | POA: Diagnosis present

## 2020-12-26 DIAGNOSIS — Z720 Tobacco use: Secondary | ICD-10-CM | POA: Diagnosis present

## 2020-12-26 DIAGNOSIS — Z79899 Other long term (current) drug therapy: Secondary | ICD-10-CM

## 2020-12-26 DIAGNOSIS — Z66 Do not resuscitate: Secondary | ICD-10-CM | POA: Diagnosis present

## 2020-12-26 DIAGNOSIS — L89896 Pressure-induced deep tissue damage of other site: Secondary | ICD-10-CM | POA: Diagnosis present

## 2020-12-26 DIAGNOSIS — I639 Cerebral infarction, unspecified: Secondary | ICD-10-CM | POA: Diagnosis present

## 2020-12-26 DIAGNOSIS — J181 Lobar pneumonia, unspecified organism: Secondary | ICD-10-CM | POA: Diagnosis present

## 2020-12-26 DIAGNOSIS — I69354 Hemiplegia and hemiparesis following cerebral infarction affecting left non-dominant side: Secondary | ICD-10-CM | POA: Diagnosis not present

## 2020-12-26 DIAGNOSIS — E860 Dehydration: Secondary | ICD-10-CM | POA: Diagnosis present

## 2020-12-26 DIAGNOSIS — Z20822 Contact with and (suspected) exposure to covid-19: Secondary | ICD-10-CM | POA: Diagnosis present

## 2020-12-26 DIAGNOSIS — C787 Secondary malignant neoplasm of liver and intrahepatic bile duct: Secondary | ICD-10-CM | POA: Diagnosis present

## 2020-12-26 DIAGNOSIS — Z89022 Acquired absence of left finger(s): Secondary | ICD-10-CM

## 2020-12-26 DIAGNOSIS — L8990 Pressure ulcer of unspecified site, unspecified stage: Secondary | ICD-10-CM | POA: Insufficient documentation

## 2020-12-26 DIAGNOSIS — Z7982 Long term (current) use of aspirin: Secondary | ICD-10-CM

## 2020-12-26 DIAGNOSIS — E878 Other disorders of electrolyte and fluid balance, not elsewhere classified: Secondary | ICD-10-CM | POA: Diagnosis present

## 2020-12-26 DIAGNOSIS — Z681 Body mass index (BMI) 19 or less, adult: Secondary | ICD-10-CM | POA: Diagnosis not present

## 2020-12-26 DIAGNOSIS — D6959 Other secondary thrombocytopenia: Secondary | ICD-10-CM | POA: Diagnosis present

## 2020-12-26 DIAGNOSIS — F1010 Alcohol abuse, uncomplicated: Secondary | ICD-10-CM | POA: Diagnosis present

## 2020-12-26 DIAGNOSIS — Z880 Allergy status to penicillin: Secondary | ICD-10-CM

## 2020-12-26 DIAGNOSIS — Z87891 Personal history of nicotine dependence: Secondary | ICD-10-CM

## 2020-12-26 DIAGNOSIS — J9601 Acute respiratory failure with hypoxia: Secondary | ICD-10-CM | POA: Diagnosis present

## 2020-12-26 DIAGNOSIS — L89616 Pressure-induced deep tissue damage of right heel: Secondary | ICD-10-CM | POA: Diagnosis present

## 2020-12-26 DIAGNOSIS — A419 Sepsis, unspecified organism: Principal | ICD-10-CM

## 2020-12-26 DIAGNOSIS — I69322 Dysarthria following cerebral infarction: Secondary | ICD-10-CM

## 2020-12-26 DIAGNOSIS — R778 Other specified abnormalities of plasma proteins: Secondary | ICD-10-CM | POA: Diagnosis present

## 2020-12-26 DIAGNOSIS — L89121 Pressure ulcer of left upper back, stage 1: Secondary | ICD-10-CM | POA: Diagnosis present

## 2020-12-26 DIAGNOSIS — G9389 Other specified disorders of brain: Secondary | ICD-10-CM

## 2020-12-26 DIAGNOSIS — Z7189 Other specified counseling: Secondary | ICD-10-CM | POA: Diagnosis not present

## 2020-12-26 DIAGNOSIS — I69391 Dysphagia following cerebral infarction: Secondary | ICD-10-CM

## 2020-12-26 DIAGNOSIS — I69991 Dysphagia following unspecified cerebrovascular disease: Secondary | ICD-10-CM

## 2020-12-26 LAB — CBC WITH DIFFERENTIAL/PLATELET
Abs Immature Granulocytes: 0.08 10*3/uL — ABNORMAL HIGH (ref 0.00–0.07)
Basophils Absolute: 0 10*3/uL (ref 0.0–0.1)
Basophils Relative: 0 %
Eosinophils Absolute: 0 10*3/uL (ref 0.0–0.5)
Eosinophils Relative: 0 %
HCT: 40.2 % (ref 39.0–52.0)
Hemoglobin: 12.5 g/dL — ABNORMAL LOW (ref 13.0–17.0)
Immature Granulocytes: 1 %
Lymphocytes Relative: 13 %
Lymphs Abs: 1.6 10*3/uL (ref 0.7–4.0)
MCH: 31.2 pg (ref 26.0–34.0)
MCHC: 31.1 g/dL (ref 30.0–36.0)
MCV: 100.2 fL — ABNORMAL HIGH (ref 80.0–100.0)
Monocytes Absolute: 0.8 10*3/uL (ref 0.1–1.0)
Monocytes Relative: 7 %
Neutro Abs: 9.7 10*3/uL — ABNORMAL HIGH (ref 1.7–7.7)
Neutrophils Relative %: 79 %
Platelets: 114 10*3/uL — ABNORMAL LOW (ref 150–400)
RBC: 4.01 MIL/uL — ABNORMAL LOW (ref 4.22–5.81)
RDW: 14.3 % (ref 11.5–15.5)
Smear Review: DECREASED
WBC: 12.2 10*3/uL — ABNORMAL HIGH (ref 4.0–10.5)
nRBC: 0 % (ref 0.0–0.2)

## 2020-12-26 LAB — COMPREHENSIVE METABOLIC PANEL
ALT: 15 U/L (ref 0–44)
AST: 33 U/L (ref 15–41)
Albumin: 2 g/dL — ABNORMAL LOW (ref 3.5–5.0)
Alkaline Phosphatase: 51 U/L (ref 38–126)
Anion gap: 12 (ref 5–15)
BUN: 61 mg/dL — ABNORMAL HIGH (ref 8–23)
CO2: 20 mmol/L — ABNORMAL LOW (ref 22–32)
Calcium: 7.9 mg/dL — ABNORMAL LOW (ref 8.9–10.3)
Chloride: 125 mmol/L — ABNORMAL HIGH (ref 98–111)
Creatinine, Ser: 1.64 mg/dL — ABNORMAL HIGH (ref 0.61–1.24)
GFR, Estimated: 44 mL/min — ABNORMAL LOW (ref >60)
Glucose, Bld: 125 mg/dL — ABNORMAL HIGH (ref 70–99)
Potassium: 3.6 mmol/L (ref 3.5–5.1)
Sodium: 157 mmol/L — ABNORMAL HIGH (ref 135–145)
Total Bilirubin: 1.2 mg/dL (ref 0.3–1.2)
Total Protein: 6 g/dL — ABNORMAL LOW (ref 6.5–8.1)

## 2020-12-26 LAB — BLOOD GAS, VENOUS
Acid-base deficit: 4.2 mmol/L — ABNORMAL HIGH (ref 0.0–2.0)
Bicarbonate: 20 mmol/L (ref 20.0–28.0)
FIO2: 0.32
O2 Saturation: 69.9 %
Patient temperature: 37
pCO2, Ven: 33 mmHg — ABNORMAL LOW (ref 44.0–60.0)
pH, Ven: 7.39 (ref 7.250–7.430)
pO2, Ven: 37 mmHg (ref 32.0–45.0)

## 2020-12-26 LAB — APTT: aPTT: 26 seconds (ref 24–36)

## 2020-12-26 LAB — PROTIME-INR
INR: 2.1 — ABNORMAL HIGH (ref 0.8–1.2)
Prothrombin Time: 22.8 seconds — ABNORMAL HIGH (ref 11.4–15.2)

## 2020-12-26 LAB — RESP PANEL BY RT-PCR (FLU A&B, COVID) ARPGX2
Influenza A by PCR: NEGATIVE
Influenza B by PCR: NEGATIVE
SARS Coronavirus 2 by RT PCR: NEGATIVE

## 2020-12-26 LAB — LACTIC ACID, PLASMA
Lactic Acid, Venous: 3.8 mmol/L (ref 0.5–1.9)
Lactic Acid, Venous: 2.6 mmol/L (ref 0.5–1.9)

## 2020-12-26 LAB — TROPONIN I (HIGH SENSITIVITY)
Troponin I (High Sensitivity): 56 ng/L — ABNORMAL HIGH (ref <18)
Troponin I (High Sensitivity): 89 ng/L — ABNORMAL HIGH (ref <18)

## 2020-12-26 MED ORDER — ACETAMINOPHEN 650 MG RE SUPP
650.0000 mg | Freq: Once | RECTAL | Status: AC
  Administered 2020-12-26: 650 mg via RECTAL
  Filled 2020-12-26: qty 1

## 2020-12-26 MED ORDER — VANCOMYCIN HCL 750 MG/150ML IV SOLN
750.0000 mg | INTRAVENOUS | Status: DC
  Administered 2020-12-28: 01:00:00 750 mg via INTRAVENOUS
  Filled 2020-12-26 (×3): qty 150

## 2020-12-26 MED ORDER — SODIUM CHLORIDE 0.9 % IV SOLN
2.0000 g | Freq: Two times a day (BID) | INTRAVENOUS | Status: DC
  Administered 2020-12-27 – 2020-12-28 (×3): 2 g via INTRAVENOUS
  Filled 2020-12-26 (×4): qty 2

## 2020-12-26 MED ORDER — SODIUM CHLORIDE 0.9 % IV SOLN
2.0000 g | Freq: Once | INTRAVENOUS | Status: AC
  Administered 2020-12-26: 2 g via INTRAVENOUS
  Filled 2020-12-26: qty 2

## 2020-12-26 MED ORDER — VANCOMYCIN HCL IN DEXTROSE 1-5 GM/200ML-% IV SOLN
1000.0000 mg | Freq: Once | INTRAVENOUS | Status: AC
  Administered 2020-12-26: 1000 mg via INTRAVENOUS
  Filled 2020-12-26: qty 200

## 2020-12-26 MED ORDER — LACTATED RINGERS IV SOLN: INTRAVENOUS | Status: DC

## 2020-12-26 MED ORDER — ENOXAPARIN SODIUM 40 MG/0.4ML ~~LOC~~ SOLN
40.0000 mg | SUBCUTANEOUS | Status: DC
  Administered 2020-12-27 (×2): 40 mg via SUBCUTANEOUS
  Filled 2020-12-26: qty 0.4

## 2020-12-26 MED ORDER — VANCOMYCIN HCL 500 MG/100ML IV SOLN
500.0000 mg | Freq: Once | INTRAVENOUS | Status: AC
  Administered 2020-12-26: 500 mg via INTRAVENOUS
  Filled 2020-12-26: qty 100

## 2020-12-26 MED ORDER — PROMETHAZINE HCL 25 MG/ML IJ SOLN: 12.5000 mg | Freq: Three times a day (TID) | INTRAMUSCULAR | Status: AC | PRN

## 2020-12-26 MED ORDER — METRONIDAZOLE IN NACL 5-0.79 MG/ML-% IV SOLN
500.0000 mg | Freq: Once | INTRAVENOUS | Status: AC
  Administered 2020-12-26: 500 mg via INTRAVENOUS
  Filled 2020-12-26: qty 100

## 2020-12-26 MED ORDER — ONDANSETRON HCL 4 MG PO TABS: 4.0000 mg | ORAL_TABLET | Freq: Four times a day (QID) | ORAL | Status: DC | PRN

## 2020-12-26 MED ORDER — ACETAMINOPHEN 650 MG RE SUPP: 650.0000 mg | Freq: Four times a day (QID) | RECTAL | Status: DC | PRN

## 2020-12-26 MED ORDER — LACTATED RINGERS IV BOLUS
1000.0000 mL | Freq: Once | INTRAVENOUS | Status: AC
  Administered 2020-12-26: 1000 mL via INTRAVENOUS

## 2020-12-26 MED ORDER — ONDANSETRON HCL 4 MG/2ML IJ SOLN: 4.0000 mg | Freq: Four times a day (QID) | INTRAMUSCULAR | Status: DC | PRN

## 2020-12-26 MED ORDER — METRONIDAZOLE IN NACL 5-0.79 MG/ML-% IV SOLN
500.0000 mg | Freq: Three times a day (TID) | INTRAVENOUS | Status: DC
  Administered 2020-12-27 – 2020-12-28 (×4): 500 mg via INTRAVENOUS
  Filled 2020-12-26 (×8): qty 100

## 2020-12-26 MED ORDER — ACETAMINOPHEN 325 MG PO TABS: 650.0000 mg | ORAL_TABLET | Freq: Four times a day (QID) | ORAL | Status: DC | PRN

## 2020-12-26 NOTE — ED Notes (Signed)
External catheter connected to suction.

## 2020-12-26 NOTE — Progress Notes (Signed)
PHARMACY -  BRIEF ANTIBIOTIC NOTE   Pharmacy has received consult(s) for vancomycin and cefepime from an ED provider.  The patient's profile has been reviewed for ht/wt/allergies/indication/available labs.    One time order(s) placed for vancomycin 1000 mg + cefepime 2 g  Further antibiotics/pharmacy consults should be ordered by admitting physician if indicated.                       Thank you,  Tawnya Crook, PharmD 12/20/2020  6:54 PM

## 2020-12-26 NOTE — Progress Notes (Signed)
CODE SEPSIS - PHARMACY COMMUNICATION  **Broad Spectrum Antibiotics should be administered within 1 hour of Sepsis diagnosis**  Time Code Sepsis Called/Page Received: 1851  Antibiotics Ordered: vanc/cefepime/metronidazole  Time of 1st antibiotic administration: 1924    Tawnya Crook ,PharmD Clinical Pharmacist  12/27/2020  7:58 PM

## 2020-12-26 NOTE — ED Notes (Signed)
Dr Tobie Poet notified of troponin 89, no new orders

## 2020-12-26 NOTE — ED Provider Notes (Signed)
[facility] Emergency Department Provider Note   ____________________________________________   Event Date/Time   First MD Initiated Contact with Patient 01/09/2021 1847     (approximate)  I have reviewed the triage vital signs and the nursing notes.   HISTORY  Chief Complaint Altered Mental Status    HPI Tyler Pierce is a 75 y.o. 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  History is limited due to patient's altered mental status.  Per EMS, [facility] resides [facility] and is minimally verbal at baseline but is able to feed himself.  Patient was last seen well sometime this morning and staff found him later this evening much less alert than usual with food around his mouth and on his chest.  On EMS arrival, patient found to have an axillary temp of 101 with initial BP of 80/40.  He was given 500 cc of IV fluids and BP improved to 100/70.  Patient remained minimally responsive throughout transport.        Past Medical History:  Diagnosis Date  . Alcohol abuse 11/06/2015  . COPD (chronic obstructive pulmonary disease) ([place])   . CVA (cerebral infarction) 11/06/2015  . 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 Dysphagia as late effect of cerebrovascular disease   . Fall 11/06/2015  . Hemiparesis affecting left side as late effect of stroke ([place])   . HLD (hyperlipidemia)   . Hypokalemia 11/06/2015  . Left-sided weakness   . Thrombocytopenia ([place]) 11/06/2015  . Tobacco abuse     Patient Active Problem List   Diagnosis Date Noted  . HLD (hyperlipidemia)   . Tobacco abuse   . Hemiparesis affecting left side as late effect of stroke ([place])   . 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 Dysphagia as late effect of cerebrovascular disease   . Left-sided weakness 11/06/2015  .  CVA (cerebral infarction) 11/06/2015  . Fall 11/06/2015  . Hypokalemia 11/06/2015  . Thrombocytopenia ([place]) 11/06/2015  . Alcohol abuse 11/06/2015    Past Surgical History:  Procedure Laterality Date  . FINGER AMPUTATION Left   . FOOT SURGERY Right     Prior to Admission medications   Medication Sig Start Date End Date Taking? Authorizing Provider  aspirin 325 MG tablet Take 1 tablet (325 mg total) by mouth daily. 11/09/15   Ghimire, Henreitta Leber, MD  atorvastatin (LIPITOR) 10 MG tablet Take 1 tablet (10 mg total) by mouth daily at 6 PM. 11/09/15   Ghimire, Henreitta Leber, MD  olopatadine (PATANOL) 0.1 % ophthalmic solution Place 1 drop into both eyes 2 (two) times daily. 11/09/15   Ghimire, Henreitta Leber, MD  senna (SENOKOT) 8.6 MG TABS tablet Take 1 tablet (8.6 mg total) by mouth at bedtime. 11/09/15   GhimireHenreitta Leber, MD    Allergies Penicillins  Family History  Problem Relation Age of Onset  . Other Other        FX HX OF COLON CANCER  . Other Other        FX HX OF COLON POLYPS    Social History Social History   Tobacco Use  . Smoking status: Former Smoker    Packs/day: 1.00    Years: 50.00    Pack years: 50.00    Types: Cigarettes    Quit date: 10/23/2015    Years since quitting: 5.1  .  Smokeless tobacco: Never Used  Vaping Use  . Vaping Use: Never used  Substance Use Topics  . Alcohol use: Yes  . Drug use: No    Review of Systems Unable to obtain secondary to altered mental status  ____________________________________________   PHYSICAL EXAM:  VITAL SIGNS: ED Triage Vitals  Enc Vitals Group     BP      Pulse      Resp      Temp      Temp src      SpO2      Weight      Height      Head Circumference      [facility]      Pain Score      Pain Loc      Pain Edu?      Excl. in [place]?     Constitutional: Minimally responsive. Eyes: Conjunctivae are normal.  Eyes deviated to left, pupils equal round and reactive to light bilaterally. Head:  Atraumatic. Nose: No congestion/rhinnorhea. Mouth/Throat: Mucous membranes are moist.  Food particles noted around mouth. Neck: Normal ROM Cardiovascular: Tachycardic, regular rhythm. Grossly normal heart sounds. Respiratory: Normal respiratory effort.  No retractions. Lungs CTAB. Gastrointestinal: Soft and nontender. No distention. Genitourinary: deferred Musculoskeletal: No lower extremity tenderness nor edema. Neurologic: Opens eyes to voice but with gaze fixed to the left.  Nonverbal per baseline.  Withdraws from pain in right upper and lower extremities, hemiparesis on left per baseline. Skin:  Skin is warm, dry and intact. No rash noted. Psychiatric: Unable to assess.  ____________________________________________   LABS (all labs ordered are listed, but only abnormal results are displayed)  Labs Reviewed  LACTIC ACID, PLASMA - Abnormal; Notable for the following components:      Result Value   Lactic Acid, Venous 3.8 (*)    All other components within normal limits  COMPREHENSIVE METABOLIC PANEL - Abnormal; Notable for the following components:   Sodium 157 (*)    Chloride 125 (*)    CO2 20 (*)    Glucose, Bld 125 (*)    BUN 61 (*)    Creatinine, Ser 1.64 (*)    Calcium 7.9 (*)    Total Protein 6.0 (*)    Albumin 2.0 (*)    GFR, Estimated 44 (*)    All other components within normal limits  BLOOD GAS, VENOUS - Abnormal; Notable for the following components:   pCO2, Ven 33 (*)    Acid-base deficit 4.2 (*)    All other components within normal limits  PROTIME-INR - Abnormal; Notable for the following components:   Prothrombin Time 22.8 (*)    INR 2.1 (*)    All other components within normal limits  CBC WITH DIFFERENTIAL/PLATELET - Abnormal; Notable for the following components:   WBC 12.2 (*)    RBC 4.01 (*)    Hemoglobin 12.5 (*)    MCV 100.2 (*)    Platelets 114 (*)    Neutro Abs 9.7 (*)    Abs Immature Granulocytes 0.08 (*)    All other components within  normal limits  TROPONIN I (HIGH SENSITIVITY) - Abnormal; Notable for the following components:   Troponin I (High Sensitivity) 56 (*)    All other components within normal limits  RESP PANEL BY RT-PCR (FLU A&B, COVID) ARPGX2  CULTURE, BLOOD (ROUTINE X 2)  CULTURE, BLOOD (ROUTINE X 2)  URINE CULTURE  APTT  LACTIC ACID, PLASMA  CBC WITH DIFFERENTIAL/PLATELET  URINALYSIS, COMPLETE (  UACMP) WITH MICROSCOPIC  TROPONIN I (HIGH SENSITIVITY)   ____________________________________________  EKG  ED ECG REPORT I, Blake Divine, the attending physician, personally viewed and interpreted this ECG.   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  Patient does have gaze preference to the left but opens eyes to voice and withdraws from pain in right upper and lower extremities.  He is febrile and I would be concerned for sepsis related to aspiration, would also consider stroke or intracranial hemorrhage.  We will start broad-spectrum antibiotics with IV fluid resuscitation.  He has a GCS of 8, O2 sats of about 92% on room air.  We will place him on supplemental oxygen via nasal cannula, but I do not believe intubation is indicated at this time.  We will also check CT head, but patient would not be a candidate for TPA.  Unfortunately, patient has no designated Education officer, community as he is never married and has no children,  only contact listed on paperwork is a friend.  I spoke with her over the phone, and she states that he would not want CPR or to be intubated in this scenario.  When patient was diagnosed with metastatic cancer and brain mass in July of last year, he was not felt to be a candidate for treatment and went home with plans for palliative care and hospice.  It appears this was never fully established and friend unfortunately does not have medical power of attorney.  Given all of these findings and a GCS of 8, it does seem reasonable to hold off on intubation and we will continue to monitor with resuscitation ongoing.  Patient would likely benefit from palliative care consultation for possible comfort care, but we will continue aggressive care for now.  Case discussed further with patient's friend at the bedside.  We are in agreement that we will withhold chest compressions or intubation if he were to further decompensate, as this is consistent with his wishes in the past when he declined further treatment at [facility].  Patient made DNR/DNI.  Blood pressure does seem to have stabilized following IV fluids and antibiotics.  He continues to open to eyes to voice and withdraw from pain on the right side.  Friend advised that patient is likely to decompensate overnight and they understand it may make sense to transition him to comfort care.  Case discussed with hospitalist for admission.      ____________________________________________   FINAL CLINICAL IMPRESSION(S) / ED DIAGNOSES  Final diagnoses:  Sepsis with acute renal failure, due to unspecified organism, unspecified acute renal failure type, unspecified whether septic shock present ([place])  Aspiration pneumonia of left lung, unspecified aspiration pneumonia type, unspecified part of lung ([place])  Brain mass     ED Discharge Orders    None       Note:  This document was prepared using Dragon voice recognition software and may include unintentional  dictation errors.   Blake Divine, MD  12/15/2020 2208  

## 2020-12-26 NOTE — ED Notes (Signed)
Pt responsive to touch. External catheter in place. RR unlabored

## 2020-12-26 NOTE — H&P (Signed)
History and Physical   Tyler Pierce JOI:[id number] DOB: July 02, 1946 DOA: 12/23/2020  PCP: Patient, No Pcp Per  Outpatient Specialists: Dr. Arther Dames, [facility] Patient coming from: Facility, [place]  I have personally briefly reviewed patient's old medical records in [place].  Chief Concern: Altered mental status  HPI: Tyler Pierce is a 75 y.o. 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  HPI and charts have been reviewed.  At bedside, patient was nonverbal and neck was side bent to the left.  Patient withdraws his right arm to pain but his left arm appears to be weak or does not move.  Patient can open eyes weakly.  Bilateral pupils dilated.  Patient is severely cachectic and malnourished.  Social history: Patient is unmarried and does not appear to have children.  He was a former tobacco user and smoked 1 to 1.5 packs/day for at least 20 years.  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  He also has a history of alcohol abuse, unclear of how many drinks per day or weeks.  ROS: Unable to complete as patient is lethargic, cachectic, ill, and nonverbal and dying  ED Course: Discussed with ED provider, patient requiring hospitalization due to sepsis presentation.  ED provider and myself agree that patient is appropriate for comfort care.  Vitals in the ED revealed fever of 101.2, respiration rate 23, heart rate 97, blood pressure 94/73, SPO2 of 95% on room air.  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)   # Severe sepsis without shock at this time-source is multifactorial with possible aspiration pneumonia -Febrile, increased respiration rate, leukocytosis, lactic acid 3.8, elevated troponin -Blood cultures x2, urine cultures -Broad-spectrum antibiotics with cefepime, vancomycin, metronidazole -Status post 2 L LR bolus per ED provider -Continue LR IVF at 150 cc/h  # Goals of care-Patient is a candidate for comfort care however patient has no family and has never been married and there is no documentation of children or other living relatives -Patient needs palliative consult care in the a.m. and likely an ethics committee Primary adenocarcinoma of the lungs metastasis to the brain (left frontal brain mass) and liver Left frontal brain tumor  Hypernatremia and hypochloremia secondary to dehydration Acute kidney injury -prerenal secondary to dehydration -Baseline sCr in July 2021 was 0.9, GFR 84 -Treat with aggressive IVF -BMP in the a.m.  # Elevated troponin-low clinical suspicion for ACS, suspect secondary to severe sepsis causing demand ischemia  # Large extra-axial mass in the left frontal convexity with associated midline shift and parietal effacement of the ventricular system and basilar cisterns # Necrotic mediastinal and left hilar lymphadenopathy likely represents metastatic disease # Several spiculated and  groundglass pulmonary nodules suspicious for multifocal primary pulmonary malignancy # Ill-defined enhancing hepatic lesions # L1 mild compression deformity # History  of decubitus ulcer # History of adenomatous polyps in the cecum status post removal in 2020 # History of alcohol abuse # History of tobacco abuse # Hyperlipidemia # Hypertension  Chart reviewed.  [place] hospitalization from 05/28/2020 to 06/06/2020: Found to have primary bilateral adenocarcinoma of the lungs, with metastasis to the brain, and liver.    Summary: Patient was living on friend's property for 20 years and was recently noticed to be deteriorating.  Patient's friend tried to get him to a SNF but needed medical evaluation so he presented to [facility] emergency department for altered mentation.  CT of the head was done at [facility] and was read as large meningioma of the left frontal lobe new since December 2016, causing 1.5 cm midline shift.  Patient was aphasia at the time and essentially nodding in agreement for transfer.  Patient was transferred to [facility] and accepted by neurosurgery service.  He had a history of a CVA in 2015/2016 and has deteriorated since then.  In the months before July 2021, it was noted in the [place] discharge summary the patient was acutely deconditioned such that he was no longer able to perform his ADLs.   At [facility], a tumor work-up was done with CT of the chest, abdomen, and pelvis which revealed necrotic mediastinal and left hilar lymphadenopathy likely represent metastatic disease.  Several spiculated and groundglass pulmonary nodules are suspicious of multifocal primary pulmonary malignancy.  Small ill-defined enhancing hepatic lesions indeterminate.  Pulmonology was consulted, and patient underwent bronchoscopy and EBUS guided biopsy.  Histology was consistent with metastatic adenocarcinoma consistent with lung as the primary.  On 05/31/2020, oncology was consulted at [facility]. Per discharge summary, given the patient's functional status, general medical condition, poor KPS, oncology team deemed that patient was not a candidate for  systemic chemotherapy or chemoradiotherapy.  Patient was not a candidate for craniotomy and resection/debulking given his medical condition and failure to thrive.  The patient's diagnosis, prognosis, natural course of disease was discussed at length with the patient at that time.  Per [facility] note, oncology explained aggressive treatment options of surgery and conservative treatment of palliative care and hospice to patient.  It was noted that patient did not want surgery or pursue radiation.  [facility] requested permission from the patient to talk to Ms. Justice Carman Ching who was caring for him and ask for help in medical decision-making.  Patient requested [place] to do so.  They had contacted Ms. Greeson on 06/01/2020 and updated her who stated they are unable to provide care but agreed with patient's preference for palliative care and SNF with hospice.  On 06/01/2020, palliative was consulted and Dr. Lenell Antu. [facility] staffed by Dr. Ladean Raya saw the patient.  On 06/06/2020, patient was deemed stable from a neurosurgical standpoint and was discharged to SNF with hospice.  His recommended diet at that time was dysphagia, mechanical soft with thin liquids and adult Ensure and life assorted with meals.   DVT prophylaxis: Enoxaparin Code Status: DNR Diet: N.p.o., aspiration precautions Family Communication: no Disposition Plan: Poor prognosis, anticipate death in the hospital Consults called: A.m. team to consult palliative and ethics committee in the a.m. Admission status: Inpatient to progressive cardiac  Past Medical History:  Diagnosis Date  . Alcohol abuse 11/06/2015  . COPD (chronic obstructive pulmonary disease) ([place])   . CVA (cerebral infarction) 11/06/2015  .  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 Dysphagia as late effect of cerebrovascular disease   . Fall 11/06/2015  . Hemiparesis affecting left side as late effect of stroke ([place])   . HLD (hyperlipidemia)   . Hypokalemia  11/06/2015  . Left-sided weakness   . Thrombocytopenia ([place]) 11/06/2015  . Tobacco abuse    Past Surgical History:  Procedure Laterality Date  . FINGER AMPUTATION Left   . FOOT SURGERY Right    Social History:  reports that he quit smoking about 5 years ago. His smoking use included cigarettes. He has a 50.00 pack-year smoking history. He has never used smokeless tobacco. He reports current alcohol use. He reports that he does not use drugs.  Allergies  Allergen Reactions  . Penicillins     Loss of consciousness  Has patient had a PCN reaction causing immediate rash, facial/tongue/throat swelling, SOB or lightheadedness with hypotension: Yes Has patient had a PCN reaction causing severe rash involving mucus membranes or skin necrosis: No Has patient had a PCN reaction that required hospitalization No Has patient had a PCN reaction occurring within the last 10 years: No If all of the above answers are "NO", then may proceed with Cephalosporin use.   Family History  Problem Relation Age of Onset  . Other Other        FX HX OF COLON CANCER  . Other Other        FX HX OF COLON POLYPS   Family history: Family history reviewed and not pertinent  Prior to Admission medications   Medication Sig Start Date End Date Taking? Authorizing Provider  aspirin 325 MG tablet Take 1 tablet (325 mg total) by mouth daily. 11/09/15   Ghimire, Henreitta Leber, MD  atorvastatin (LIPITOR) 10 MG tablet Take 1 tablet (10 mg total) by mouth daily at 6 PM. 11/09/15   Ghimire, Henreitta Leber, MD  olopatadine (PATANOL) 0.1 % ophthalmic solution Place 1 drop into both eyes 2 (two) times daily. 11/09/15   Ghimire, Henreitta Leber, MD  senna (SENOKOT) 8.6 MG TABS tablet Take 1 tablet (8.6 mg total) by mouth at bedtime. 11/09/15   Jonetta Osgood, MD   Physical Exam: Vitals:   01/02/2021 2130 12/19/2020 2145 01/02/2021 2200 12/19/2020 2230  BP: 91/73 94/73    Pulse: 97 97    Resp: (!) 23 (!) 23    Temp:   98.9 F (37.2 C)    TempSrc:   Axillary   SpO2: 95% 95%    Weight:    58 kg  Height:       Constitutional: appears cachectic, frail, malnourished, poorly kept Eyes: PERRL, lids and conjunctivae normal ENMT: Mucous membranes are dry. Poor dentition. Hearing not able to be assessed Neck: normal, supple, no masses, no thyromegaly Respiratory: clear to auscultation bilaterally, no wheezing, no crackles. Normal respiratory effort. No accessory muscle use.  Cardiovascular: Regular rate and rhythm, no murmurs / rubs / gallops. No extremity edema. 2+ pedal pulses. No carotid bruits.  Abdomen: Scaphoid abdomen, no masses palpated, no hepatosplenomegaly.  Musculoskeletal: no clubbing / cyanosis.  Bilateral upper and lower extremity atrophic muscles.  Left-sided hemiparesis.  With right hand and wrist twisted Skin: no rashes, lesions, ulcers. No induration Neurologic: lethargic, weak, frail, ill, dying Psychiatric: Nonverbal, withdraws to pain, opens eyes to sternal rub and loud verbal stimuli  EKG: independently reviewed, showing sinus tachycardia, with low amplitude diffusely, rate of 104, QTc 453  Chest x-ray on Admission: I personally reviewed and  I agree with radiologist reading as below.  CT Head Wo Contrast  Result Date: 12/17/2020 CLINICAL DATA:  Mental status change EXAM: CT HEAD WITHOUT CONTRAST TECHNIQUE: Contiguous axial images were obtained from the base of the skull through the vertex without intravenous contrast. COMPARISON:  CT brain 05/28/2020, MRI 11/06/2015 FINDINGS: Brain: Large left convexity extra-axial heterogenous mass measuring approximately 10.5 x 4.7 by 9.3 cm, previously 9.6 by 4.5 x 8.6 cm when measured in a similar fashion on previous exam. Mass slightly dense at its periphery with central low attenuation and scattered calcification. Increased midline shift to the right, measuring 18 mm, previously 15 mm. Persistent severe mass effect on the left lateral ventricle. Subfalcine herniation to  the right as before. Significant local mass effect with surrounding edema as before. Mild left sulcal effacement consistent with swelling. Underlying chronic small vessel ischemic change of the white matter. Redemonstrated chronic lacunar infarcts within the basal ganglia. Vascular: No hyperdense vessels.  Carotid vascular calcification. Skull: No fracture Sinuses/Orbits: No acute finding. Mild mucosal thickening in the sinuses Other: None IMPRESSION: 1. Large left convexity extra-axial heterogenous mass, increased in size compared to most recent prior exam. Persistent severe local mass effect with subfalcine herniation as before; slight increased midline shift to the right, now 18 mm previously 15 mm. No definitive hemorrhage identified. 2. Atrophy and chronic small vessel ischemic changes of the white matter. Chronic lacunar infarcts within the basal ganglia. Critical Value/emergent results were called by telephone at the time of interpretation on 12/23/2020 at 8:13 pm to provider [facility] , who verbally acknowledged these results. Electronically Signed   By: Donavan Foil M.D.   On: 12/28/2020 20:14   DG Chest Port 1 View  Result Date: 12/16/2020 CLINICAL DATA:  Possible sepsis EXAM: PORTABLE CHEST 1 VIEW COMPARISON:  05/28/2020 FINDINGS: The heart size and mediastinal contours are within normal limits. Atherosclerotic calcification of the aortic knob. Streaky left perihilar and left basilar airspace opacity. Right lung is clear. No pleural effusion or pneumothorax. IMPRESSION: Streaky left perihilar and left basilar airspace opacity, suspicious for pneumonia and/or aspiration. Electronically Signed   By: Davina Poke D.O.   On: 01/09/2021 19:53   Labs on Admission: I have personally reviewed following labs  CBC: Recent Labs  Lab 01/09/2021 1857  WBC 12.2*  NEUTROABS 9.7*  HGB 12.5*  HCT 40.2  MCV 100.2*  PLT 462*   Basic Metabolic Panel: Recent Labs  Lab 12/30/2020 1857  NA 157*  K  3.6  CL 125*  CO2 20*  GLUCOSE 125*  BUN 61*  CREATININE 1.64*  CALCIUM 7.9*   GFR: Estimated Creatinine Clearance: 32.9 mL/min (A) (by C-G formula based on SCr of 1.64 mg/dL (H)).  Liver Function Tests: Recent Labs  Lab 12/28/2020 1857  AST 33  ALT 15  ALKPHOS 51  BILITOT 1.2  PROT 6.0*  ALBUMIN 2.0*   Coagulation Profile: Recent Labs  Lab 01/05/2021 1857  INR 2.1*   Urine analysis:    Component Value Date/Time   COLORURINE ORANGE (A) 11/06/2015 0035   APPEARANCEUR CLEAR 11/06/2015 0035   LABSPEC 1.024 11/06/2015 0035   PHURINE 6.0 11/06/2015 0035   GLUCOSEU NEGATIVE 11/06/2015 0035   HGBUR NEGATIVE 11/06/2015 0035   BILIRUBINUR SMALL (A) 11/06/2015 0035   KETONESUR 15 (A) 11/06/2015 0035   PROTEINUR NEGATIVE 11/06/2015 0035   NITRITE POSITIVE (A) 11/06/2015 0035   LEUKOCYTESUR SMALL (A) 11/06/2015 0035   Kavin Weckwerth N Daina Cara D.O. [facility]  If 7PM-7AM, please contact  overnight-coverage provider If 7AM-7PM, please contact day coverage provider [email or web address]  12/15/2020, 11:19 PM

## 2020-12-26 NOTE — Progress Notes (Signed)
Elink following for Sepsis Protocol 

## 2020-12-26 NOTE — ED Triage Notes (Signed)
Patient arrive emergency traffic via EMS for not responsive from [facility]. Patient typically A&O. Last known well unknown.

## 2020-12-26 NOTE — Progress Notes (Signed)
Pharmacy Antibiotic Note  Tyler Pierce is a 75 y.o. male admitted on 12/16/2020 with sepsis.  Pharmacy has been consulted for Vanc, Cefepime dosing.  Plan: Vancomycin 1 gm IV X 1 given in ED on 2/14 @ 2031. Additional Vanc 500 mg IV X 1 ordered to make total loading dose of 1500 mg. Vancomycin 750 mg IV Q24H ordered to start on 2/15 @ 2000.  AUC = 450.5 ,   Vanc trough = 11.7   Cefepime 2 gm IV X 1 given in ED on 2/14 @ 2009. Cefepime 2 gm IV Q12H ordered to start on 2/15 @ 0800.   Height: 5\' 11"  (180.3 cm) Weight: 58 kg (127 lb 13.9 oz) IBW/kg (Calculated) : 75.3  Temp (24hrs), Avg:100.1 F (37.8 C), Min:98.9 F (37.2 C), Max:101.2 F (38.4 C)  Recent Labs  Lab 12/18/2020 1857  WBC 12.2*  CREATININE 1.64*  LATICACIDVEN 3.8*    Estimated Creatinine Clearance: 32.9 mL/min (A) (by C-G formula based on SCr of 1.64 mg/dL (H)).    Allergies  Allergen Reactions  . Penicillins     Loss of consciousness  Has patient had a PCN reaction causing immediate rash, facial/tongue/throat swelling, SOB or lightheadedness with hypotension: Yes Has patient had a PCN reaction causing severe rash involving mucus membranes or skin necrosis: No Has patient had a PCN reaction that required hospitalization No Has patient had a PCN reaction occurring within the last 10 years: No If all of the above answers are "NO", then may proceed with Cephalosporin use.    Antimicrobials this admission:   >>    >>   Dose adjustments this admission:   Microbiology results:  BCx:   UCx:    Sputum:    MRSA PCR:   Thank you for allowing pharmacy to be a part of this patient's care.  Keyanni Whittinghill D 12/30/2020 10:45 PM

## 2020-12-27 DIAGNOSIS — E870 Hyperosmolality and hypernatremia: Secondary | ICD-10-CM

## 2020-12-27 DIAGNOSIS — Z8673 Personal history of transient ischemic attack (TIA), and cerebral infarction without residual deficits: Secondary | ICD-10-CM

## 2020-12-27 DIAGNOSIS — G935 Compression of brain: Secondary | ICD-10-CM

## 2020-12-27 DIAGNOSIS — C569 Malignant neoplasm of unspecified ovary: Secondary | ICD-10-CM | POA: Insufficient documentation

## 2020-12-27 DIAGNOSIS — E876 Hypokalemia: Secondary | ICD-10-CM

## 2020-12-27 DIAGNOSIS — J181 Lobar pneumonia, unspecified organism: Secondary | ICD-10-CM

## 2020-12-27 DIAGNOSIS — A419 Sepsis, unspecified organism: Secondary | ICD-10-CM | POA: Diagnosis not present

## 2020-12-27 LAB — URINALYSIS, COMPLETE (UACMP) WITH MICROSCOPIC
Bacteria, UA: NONE SEEN
Bilirubin Urine: NEGATIVE
Glucose, UA: NEGATIVE mg/dL
Hgb urine dipstick: NEGATIVE
Ketones, ur: 5 mg/dL — AB
Nitrite: NEGATIVE
Protein, ur: NEGATIVE mg/dL
Specific Gravity, Urine: 1.024 (ref 1.005–1.030)
pH: 5 (ref 5.0–8.0)

## 2020-12-27 LAB — PROCALCITONIN: Procalcitonin: 0.7 ng/mL

## 2020-12-27 LAB — MAGNESIUM: Magnesium: 2.5 mg/dL — ABNORMAL HIGH (ref 1.7–2.4)

## 2020-12-27 MED ORDER — DEXAMETHASONE SODIUM PHOSPHATE 10 MG/ML IJ SOLN
10.0000 mg | Freq: Three times a day (TID) | INTRAMUSCULAR | Status: DC
  Administered 2020-12-27 – 2020-12-29 (×6): 10 mg via INTRAVENOUS
  Filled 2020-12-27 (×7): qty 1

## 2020-12-27 MED ORDER — POTASSIUM CHLORIDE 10 MEQ/100ML IV SOLN
10.0000 meq | INTRAVENOUS | Status: AC
  Administered 2020-12-27 (×3): 10 meq via INTRAVENOUS
  Filled 2020-12-27 (×3): qty 100

## 2020-12-27 MED ORDER — METOPROLOL TARTRATE 5 MG/5ML IV SOLN
5.0000 mg | INTRAVENOUS | Status: DC | PRN
  Administered 2020-12-27 – 2020-12-29 (×4): 5 mg via INTRAVENOUS
  Filled 2020-12-27 (×5): qty 5

## 2020-12-27 MED ORDER — DILTIAZEM HCL 60 MG PO TABS: 30.0000 mg | ORAL_TABLET | Freq: Four times a day (QID) | ORAL | Status: DC

## 2020-12-27 MED ORDER — DEXTROSE 5 % IV SOLN: INTRAVENOUS | Status: DC

## 2020-12-27 MED ORDER — MORPHINE SULFATE (PF) 2 MG/ML IV SOLN
2.0000 mg | INTRAVENOUS | Status: DC | PRN
  Administered 2020-12-28: 2 mg via INTRAVENOUS
  Filled 2020-12-27: qty 1

## 2020-12-27 NOTE — Progress Notes (Signed)
Pt nonverbal, minimal responsiveness and no known family. Unable to complete admission profile. No belonging brought to unit with pt. No belongings at bedside.

## 2020-12-27 NOTE — ED Notes (Signed)
Pt breathing more labored, HR up to 160's.  Metoprolol given, Dr. Leslye Peer aware of changes,  Advised of bed placement.  Pt to be transported to floor when transport available.

## 2020-12-27 NOTE — ED Notes (Signed)
Pt readjusted in bed

## 2020-12-27 NOTE — Progress Notes (Incomplete)
Pt previously yellow mews prior to admission onto unit. Continue to monitor

## 2020-12-27 NOTE — ED Notes (Signed)
Pt had brief episode of ST, rate increased to 140's, lasted approx 5-6 minutes.  Pt now back to NSR at rate of 83.  Dr. Leslye Peer notified.  Awaiting further orders,  Will continue to monitor.

## 2020-12-27 NOTE — ED Notes (Signed)
Pt back in NSR. Randol Kern, NP aware.

## 2020-12-27 NOTE — ED Notes (Signed)
Randol Kern NP notified of pt cardiac changes. Pt has been NSR with HR 80's since this RN took over care. Pt now ST with HR 150's. RR even and unlabored, NAD noted Pt continues to be non verbal. Opens eyes to verbal stimuli

## 2020-12-27 NOTE — ED Notes (Signed)
Randol Kern NP notified of no urine output. Bladder scan 194 mL

## 2020-12-27 NOTE — Progress Notes (Signed)
Patient ID: Tyler Pierce, male   DOB: 03-Mar-1946, 75 y.o.   MRN: [id number] [facility] PROGRESS NOTE  Tyler Pierce:[id number] DOB: 07-28-1946 DOA: 01/01/2021 PCP: Patient, No Pcp Per  HPI/Subjective: Patient unable to provide any history at this time.  Admitted with severe sepsis and started on antibiotics.  Patient looks like he is actively dying.  Had a small amount of vomit next to his mouth when I saw him.  Objective: Vitals:   12/27/20 0800 12/27/20 0808  BP: 115/82 117/72  Pulse: (!) 148 83  Resp: 18 20  Temp:    SpO2: 99% 98%    Intake/Output Summary (Last 24 hours) at 12/27/2020 0901 Last data filed at 12/27/2020 0854 Gross per 24 hour  Intake 696.42 ml  Output 150 ml  Net 546.42 ml   Filed Weights   01/06/2021 2230  Weight: 58 kg    ROS: Review of Systems  Unable to perform ROS: Acuity of condition   Exam: Physical Exam HENT:     Head: Normocephalic.     Mouth/Throat:     Comments: Unable to look in mouth. Eyes:     General: Lids are normal.     Conjunctiva/sclera: Conjunctivae normal.  Cardiovascular:     Rate and Rhythm: Regular rhythm. Tachycardia present.     Heart sounds: Normal heart sounds, S1 normal and S2 normal.  Pulmonary:     Effort: Accessory muscle usage present.     Breath sounds: Examination of the right-lower field reveals decreased breath sounds. Examination of the left-lower field reveals decreased breath sounds. Decreased breath sounds present. No wheezing, rhonchi or rales.  Abdominal:     Palpations: Abdomen is soft.     Tenderness: There is no abdominal tenderness.  Musculoskeletal:     Right lower leg: No swelling.     Left lower leg: No swelling.  Skin:    General: Skin is warm.     Findings: No rash.  Neurological:     Mental Status: He is lethargic.       Data Reviewed: Basic Metabolic Panel: Recent Labs  Lab 12/30/2020 1857 12/31/2020 2159  NA 157*  --   K 3.6  --   CL 125*  --   CO2 20*  --   GLUCOSE  125*  --   BUN 61*  --   CREATININE 1.64*  --   CALCIUM 7.9*  --   MG  --  2.5*   Liver Function Tests: Recent Labs  Lab 01/02/2021 1857  AST 33  ALT 15  ALKPHOS 51  BILITOT 1.2  PROT 6.0*  ALBUMIN 2.0*   CBC: Recent Labs  Lab 12/18/2020 1857  WBC 12.2*  NEUTROABS 9.7*  HGB 12.5*  HCT 40.2  MCV 100.2*  PLT 114*     Recent Results (from the past 240 hour(s))  Resp Panel by RT-PCR (Flu A&B, Covid) Nasopharyngeal Swab     Status: None   Collection Time: 12/23/2020  6:57 PM   Specimen: Nasopharyngeal Swab; Nasopharyngeal(NP) swabs in vial transport medium  Result Value Ref Range Status   SARS Coronavirus 2 by RT PCR NEGATIVE NEGATIVE Final    Comment: (NOTE) SARS-CoV-2 target nucleic acids are NOT DETECTED.  The SARS-CoV-2 RNA is generally detectable in upper respiratory specimens during the acute phase of infection. The lowest concentration of SARS-CoV-2 viral copies this assay can detect is 138 copies/mL. A negative result does not preclude SARS-Cov-2 infection and should not be used as the sole  basis for treatment or other patient management decisions. A negative result may occur with  improper specimen collection/handling, submission of specimen other than nasopharyngeal swab, presence of viral mutation(s) within the areas targeted by this assay, and inadequate number of viral copies(<138 copies/mL). A negative result must be combined with clinical observations, patient history, and epidemiological information. The expected result is Negative.  Fact Sheet for Patients:  EntrepreneurPulse.com.au  Fact Sheet for Healthcare Providers:  IncredibleEmployment.be  This test is no t yet approved or cleared by the [place] FDA and  has been authorized for detection and/or diagnosis of SARS-CoV-2 by FDA under an Emergency Use Authorization (EUA). This EUA will remain  in effect (meaning this test can be used) for the duration of  the COVID-19 declaration under Section 564(b)(1) of the Act, 21 U.S.C.section 360bbb-3(b)(1), unless the authorization is terminated  or revoked sooner.       Influenza A by PCR NEGATIVE NEGATIVE Final   Influenza B by PCR NEGATIVE NEGATIVE Final    Comment: (NOTE) The Xpert Xpress SARS-CoV-2/FLU/RSV plus assay is intended as an aid in the diagnosis of influenza from Nasopharyngeal swab specimens and should not be used as a sole basis for treatment. Nasal washings and aspirates are unacceptable for Xpert Xpress SARS-CoV-2/FLU/RSV testing.  Fact Sheet for Patients: EntrepreneurPulse.com.au  Fact Sheet for Healthcare Providers: IncredibleEmployment.be  This test is not yet approved or cleared by the [place] FDA and has been authorized for detection and/or diagnosis of SARS-CoV-2 by FDA under an Emergency Use Authorization (EUA). This EUA will remain in effect (meaning this test can be used) for the duration of the COVID-19 declaration under Section 564(b)(1) of the Act, 21 U.S.C. section 360bbb-3(b)(1), unless the authorization is terminated or revoked.  Performed at [facility], [address] [place], [place] [address]   Blood Culture (routine x 2)     Status: None (Preliminary result)   Collection Time: 12/29/2020  6:57 PM   Specimen: BLOOD  Result Value Ref Range Status   Specimen Description BLOOD BLOOD RIGHT WRIST  Final   Special Requests   Final    BOTTLES DRAWN AEROBIC AND ANAEROBIC Blood Culture results may not be optimal due to an inadequate volume of blood received in culture bottles   Culture   Final    NO GROWTH < 12 HOURS Performed at [facility], [address], [place], [place] [address]    Report Status PENDING  Incomplete  Blood Culture (routine x 2)     Status: None (Preliminary result)   Collection Time: 12/15/2020  6:57 PM   Specimen: BLOOD  Result Value Ref Range Status   Specimen  Description BLOOD BLOOD LEFT WRIST  Final   Special Requests   Final    BOTTLES DRAWN AEROBIC AND ANAEROBIC Blood Culture results may not be optimal due to an inadequate volume of blood received in culture bottles   Culture   Final    NO GROWTH < 12 HOURS Performed at [facility], [address] [place], [place] [address]    Report Status PENDING  Incomplete     Studies: CT Head Wo Contrast  Result Date: 01/02/2021 CLINICAL DATA:  Mental status change EXAM: CT HEAD WITHOUT CONTRAST TECHNIQUE: Contiguous axial images were obtained from the base of the skull through the vertex without intravenous contrast. COMPARISON:  CT brain 05/28/2020, MRI 11/06/2015 FINDINGS: Brain: Large left convexity extra-axial heterogenous mass measuring approximately 10.5 x 4.7 by 9.3 cm, previously 9.6  by 4.5 x 8.6 cm when measured in a similar fashion on previous exam. Mass slightly dense at its periphery with central low attenuation and scattered calcification. Increased midline shift to the right, measuring 18 mm, previously 15 mm. Persistent severe mass effect on the left lateral ventricle. Subfalcine herniation to the right as before. Significant local mass effect with surrounding edema as before. Mild left sulcal effacement consistent with swelling. Underlying chronic small vessel ischemic change of the white matter. Redemonstrated chronic lacunar infarcts within the basal ganglia. Vascular: No hyperdense vessels.  Carotid vascular calcification. Skull: No fracture Sinuses/Orbits: No acute finding. Mild mucosal thickening in the sinuses Other: None IMPRESSION: 1. Large left convexity extra-axial heterogenous mass, increased in size compared to most recent prior exam. Persistent severe local mass effect with subfalcine herniation as before; slight increased midline shift to the right, now 18 mm previously 15 mm. No definitive hemorrhage identified. 2. Atrophy and chronic small vessel ischemic changes of the  white matter. Chronic lacunar infarcts within the basal ganglia. Critical Value/emergent results were called by telephone at the time of interpretation on 12/28/2020 at 8:13 pm to provider [facility] , who verbally acknowledged these results. Electronically Signed   By: Donavan Foil M.D.   On: 01/07/2021 20:14   DG Chest Port 1 View  Result Date: 12/27/2020 CLINICAL DATA:  Possible sepsis EXAM: PORTABLE CHEST 1 VIEW COMPARISON:  05/28/2020 FINDINGS: The heart size and mediastinal contours are within normal limits. Atherosclerotic calcification of the aortic knob. Streaky left perihilar and left basilar airspace opacity. Right lung is clear. No pleural effusion or pneumothorax. IMPRESSION: Streaky left perihilar and left basilar airspace opacity, suspicious for pneumonia and/or aspiration. Electronically Signed   By: Davina Poke D.O.   On: 12/24/2020 19:53    Scheduled Meds: . diltiazem  30 mg Oral Q6H  . enoxaparin (LOVENOX) injection  40 mg Subcutaneous Q24H   Continuous Infusions: . ceFEPime (MAXIPIME) IV Stopped (12/27/20 0854)  . dextrose 100 mL/hr at 12/27/20 0808  . metronidazole Stopped (12/27/20 0232)  . vancomycin      Assessment/Plan:  1. Severe sepsis, present on admission with fever, tachycardia, leukocytosis, acute metabolic encephalopathy, lobar pneumonia.  Aggressive antibiotics with cefepime Flagyl and vancomycin.  Follow-up cultures.  Mental status not good enough at this point for diet. 2. 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  CT scan showing slightly more of a herniation than last time.  We will start Decadron.  Palliative care consultation.  TOC to consult hospice. 3. Hypernatremia and hypokalemia.  Switch IV fluids over to D5W.  We will give potassium runs.  With hypernatremia this is a overall poor prognostic sign. 4. Intermittent SVT.  As needed IV metoprolol.  I do not think he will be able to have the oral Cardizem. 5. Vomiting.  As  needed Zofran. 6. History of stroke with left-sided weakness 7. Overall prognosis is poor.  I do not expect the patient to survive the hospitalization        Code Status:     Code Status Orders  (From admission, onward)         Start     Ordered   01/01/2021 2213  Do not attempt resuscitation (DNR)  Continuous       Question Answer Comment  In the event of cardiac or respiratory ARREST Do not call a "code blue"   In the event of cardiac or respiratory ARREST Do not perform Intubation, CPR, defibrillation or ACLS  In the event of cardiac or respiratory ARREST Use medication by any route, position, wound care, and other measures to relive pain and suffering. May use oxygen, suction and manual treatment of airway obstruction as needed for comfort.      01/02/2021 2216        Code Status History    Date Active Date Inactive Code Status Order ID Comments User Context   01/02/2021 2038 12/18/2020 2216 DNR [id number]  Blake Divine, MD ED   12/29/2020 1903 01/08/2021 1909 DNR [id number]  Blake Divine, MD ED   11/06/2015 0349 11/09/2015 2027 Full Code [id number]  Theressa Millard, MD Inpatient   Advance Care Planning Activity     Family Communication: Spoke with friend on the phone who is the primary contact Disposition Plan: Status is: Inpatient  Dispo: The patient is from: Facility              Anticipated d/c is to: Facility versus hospice home              Anticipated d/c date is: May not survive this hospitalization              Patient currently receiving IV antibiotics for severe sepsis.  Patient also has metastatic cancer to brain with herniation.  On IV Decadron   Difficult to place patient.  Hopefully can go back to facility with hospice versus hospice home.  Time spent: 29 minutes   [place]

## 2020-12-28 DIAGNOSIS — G9341 Metabolic encephalopathy: Secondary | ICD-10-CM | POA: Diagnosis not present

## 2020-12-28 DIAGNOSIS — Z7189 Other specified counseling: Secondary | ICD-10-CM | POA: Diagnosis not present

## 2020-12-28 DIAGNOSIS — Z515 Encounter for palliative care: Secondary | ICD-10-CM | POA: Diagnosis not present

## 2020-12-28 DIAGNOSIS — I471 Supraventricular tachycardia, unspecified: Secondary | ICD-10-CM

## 2020-12-28 DIAGNOSIS — Z66 Do not resuscitate: Secondary | ICD-10-CM

## 2020-12-28 DIAGNOSIS — A419 Sepsis, unspecified organism: Secondary | ICD-10-CM | POA: Diagnosis not present

## 2020-12-28 DIAGNOSIS — J181 Lobar pneumonia, unspecified organism: Secondary | ICD-10-CM | POA: Diagnosis not present

## 2020-12-28 DIAGNOSIS — L89153 Pressure ulcer of sacral region, stage 3: Secondary | ICD-10-CM

## 2020-12-28 DIAGNOSIS — C3491 Malignant neoplasm of unspecified part of right bronchus or lung: Secondary | ICD-10-CM | POA: Diagnosis not present

## 2020-12-28 DIAGNOSIS — L8990 Pressure ulcer of unspecified site, unspecified stage: Secondary | ICD-10-CM | POA: Insufficient documentation

## 2020-12-28 LAB — CBC
HCT: 36.4 % — ABNORMAL LOW (ref 39.0–52.0)
Hemoglobin: 11.1 g/dL — ABNORMAL LOW (ref 13.0–17.0)
MCH: 30.7 pg (ref 26.0–34.0)
MCHC: 30.5 g/dL (ref 30.0–36.0)
MCV: 100.6 fL — ABNORMAL HIGH (ref 80.0–100.0)
Platelets: 100 10*3/uL — ABNORMAL LOW (ref 150–400)
RBC: 3.62 MIL/uL — ABNORMAL LOW (ref 4.22–5.81)
RDW: 13.6 % (ref 11.5–15.5)
WBC: 10.7 10*3/uL — ABNORMAL HIGH (ref 4.0–10.5)
nRBC: 0 % (ref 0.0–0.2)

## 2020-12-28 LAB — BASIC METABOLIC PANEL
Anion gap: 8 (ref 5–15)
BUN: 45 mg/dL — ABNORMAL HIGH (ref 8–23)
CO2: 24 mmol/L (ref 22–32)
Calcium: 8.4 mg/dL — ABNORMAL LOW (ref 8.9–10.3)
Chloride: 116 mmol/L — ABNORMAL HIGH (ref 98–111)
Creatinine, Ser: 0.94 mg/dL (ref 0.61–1.24)
GFR, Estimated: >60 mL/min (ref >60)
Glucose, Bld: 170 mg/dL — ABNORMAL HIGH (ref 70–99)
Potassium: 3.3 mmol/L — ABNORMAL LOW (ref 3.5–5.1)
Sodium: 148 mmol/L — ABNORMAL HIGH (ref 135–145)

## 2020-12-28 LAB — MRSA PCR SCREENING: MRSA by PCR: NEGATIVE

## 2020-12-28 LAB — GLUCOSE, CAPILLARY: Glucose-Capillary: 164 mg/dL — ABNORMAL HIGH (ref 70–99)

## 2020-12-28 MED ORDER — LACTATED RINGERS IV BOLUS
250.0000 mL | Freq: Once | INTRAVENOUS | Status: AC
  Administered 2020-12-28: 01:00:00 250 mL via INTRAVENOUS

## 2020-12-28 MED ORDER — POTASSIUM CL IN DEXTROSE 5% 20 MEQ/L IV SOLN
20.0000 meq | INTRAVENOUS | Status: DC
  Administered 2020-12-28 – 2020-12-29 (×2): 20 meq via INTRAVENOUS
  Filled 2020-12-28 (×4): qty 1000

## 2020-12-28 MED ORDER — SODIUM CHLORIDE 0.9 % IV BOLUS
500.0000 mL | Freq: Once | INTRAVENOUS | Status: AC
  Administered 2020-12-28: 500 mL via INTRAVENOUS

## 2020-12-28 NOTE — Consult Note (Signed)
Consultation Note Date: 12/28/2020   Patient Name: Tyler Pierce  DOB: [birth year]  MRN: [id number]  Age / Sex: 75 y.o., male  PCP: Patient, No Pcp Per Referring Physician: Loletha Grayer, MD  Reason for Consultation: Establishing goals of care  HPI/Patient Profile: 75 y.o. 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 Diagnosed with severe sepsis and pneumonia. Required transfer to ICU AM of 2/16 for bipap. Patient remains encephalopathic. Also with stage 3 pressure ulcer. PMT consulted to discuss [place].   Clinical Assessment and Goals of Care: I have reviewed medical records including EPIC notes, labs and imaging, received report from RN and Dr. Leslye Peer, assessed the patient and then spoke with patient's friend. Tyler Pierce, to discuss diagnosis prognosis, GOC, EOL wishes, disposition and options.  Patient does not have spouse/children/next of kin. Only known contact is a friend named Tyler Pierce. In previous medical documentation patient had mentioned he would want Tyler Pierce to make decisions for him if he were unable. It should also be noted that patient was pursuing hospice care outpatient and was not interested in aggressive interventions per chart review.   When speaking to Tyler Pierce, I introduced Palliative Medicine as specialized medical care for people living with serious illness. It focuses on providing relief from the symptoms and stress of a serious illness. The goal is to improve quality of life for both the patient and the family.  Tyler Pierce confirms she is closest person to patient, no family. She tells me she has known him for about 20 years.   As far as  functional and nutritional status, she tells me he was essentially nonambulatory. She tells me of poor appetite and weight loss.     We discussed patient's current illness and what it means in the larger context of patient's on-going co-morbidities.  Natural disease trajectory and expectations at EOL were discussed. We review his metastatic cancer and now respiratory failure. We also review pursuit of comfort measures outpatient. Tyler Pierce confirms this is correct.   The difference between aggressive medical intervention and comfort care was considered in light of the patient's goals of care. Tyler Pierce tells me patient would just not want to suffer and she agrees with this. We discuss what a transition to comfort measures only would look life for patient and Tyler Pierce confirms this is appropriate.   We discuss a plan to continue bipap and fluids for now, until Tyler Pierce can come and see him this evening. Once she visits, she agrees to full comfort measures including freeing Tyler Pierce from bipap. We discuss that time may be short and she expresses understanding.     Questions and concerns were addressed. Tyler Pierce was encouraged to call with questions or concerns.   Primary Decision Maker OTHER - friend Tyler Pierce/individual with established relationship with patient who is acting in good faith can can reliably convey wishes of patient   SUMMARY OF RECOMMENDATIONS   - comfort measures only after friend, Tyler Pierce, is able to visit patient this evening - for now, continue bipap and fluids, this can be discontinued after her visit - will d/c other measures and meds not necessary for comfort - may need escalation of comfort meds following removal of bipap, recommend premedicating with morphine prior to removal  Code Status/Advance Care Planning:  DNR  Symptom Management:   PRN morphine  Prognosis:   Hours - Days  Discharge Planning: Anticipated Hospital Death      Primary Diagnoses: Present on  Admission: . Sepsis ([place]) . Alcohol abuse . Cerebral infarction ([place]) . HLD (hyperlipidemia) . Thrombocytopenia ([place]) . Tobacco abuse . Adenocarcinoma of right lung metastatic to liver ([place]) . Severe sepsis ([place])   I have reviewed the medical record, interviewed the patient and family, and examined the patient. The following aspects are pertinent.  Past Medical History:  Diagnosis Date  . Alcohol abuse 11/06/2015  . COPD (chronic obstructive pulmonary disease) ([place])   . CVA (cerebral infarction) 11/06/2015  . 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 Dysphagia as late effect of cerebrovascular disease   . Fall 11/06/2015  . Hemiparesis affecting left side as late effect of stroke ([place])   . HLD (hyperlipidemia)   . Hypokalemia 11/06/2015  . Left-sided weakness   . Thrombocytopenia ([place]) 11/06/2015  . Tobacco abuse    Social History   Socioeconomic History  . Marital status: Single    Spouse name: Not on file  . Number of children: Not on file  . Years of education: Not on file  . Highest education level: Not on file  Occupational History  . Not on file  Tobacco Use  . Smoking status: Former Smoker    Packs/day: 1.00    Years: 50.00    Pack years: 50.00    Types: Cigarettes    Quit date: 10/23/2015    Years since quitting: 5.1  . Smokeless tobacco: Never Used  Vaping Use  . Vaping Use: Never used  Substance and Sexual Activity  . Alcohol use: Yes  . Drug use: No  . Sexual activity: Not on file  Other Topics Concern  . Not on file  Social History Narrative  . Not on file   Social Determinants of Health   Financial Resource Strain: Not on file  Food Insecurity: Not on file  Transportation Needs: Not on file  Physical Activity: Not on file  Stress: Not on file  Social Connections: Not on file   Family History  Problem Relation Age of Onset  . Other Other        FX HX OF COLON CANCER  . Other Other        FX HX OF COLON POLYPS   Scheduled  Meds: . dexamethasone (DECADRON) injection  10 mg Intravenous Q8H   Continuous Infusions: . dextrose 5 % with KCl 20 mEq / L 20 mEq (12/28/20 0945)   PRN Meds:.acetaminophen **OR** acetaminophen, metoprolol tartrate, morphine injection, ondansetron **OR** ondansetron (ZOFRAN) IV, promethazine Allergies  Allergen Reactions  . Penicillins     Loss of consciousness  Has patient had a PCN reaction causing immediate rash, facial/tongue/throat swelling, SOB or lightheadedness with hypotension: Yes Has patient had a PCN reaction causing severe rash involving mucus membranes or skin necrosis: No Has patient had a PCN reaction that required hospitalization No Has patient had a PCN reaction occurring within the last 10 years: No If all of the above answers are "NO", then may proceed with Cephalosporin use.   Review of Systems  Unable to perform ROS: Patient unresponsive    Physical Exam Constitutional:      Comments: Opens eyes to voice but does not follow commands or turn head to voice  cachectic  Pulmonary:     Comments: Bipap in place Skin:    General: Skin is warm and dry.  Vital Signs: BP 101/70   Pulse 65   Temp 97.7 F (36.5 C) (Axillary)   Resp 11   Ht (P) 5\' 11"  (1.803 m)   Wt (P) 48.8 kg   SpO2 98%   BMI (P) 14.99 kg/m  Pain Scale: Faces   Pain Score: Asleep   SpO2: SpO2: 98 % O2 Device:SpO2: 98 % O2 Flow Rate: .O2 Flow Rate (L/min): 6 L/min  IO: Intake/output summary:   Intake/Output Summary (Last 24 hours) at 12/28/2020 1450 Last data filed at 12/28/2020 0620 Gross per 24 hour  Intake 989.7 ml  Output 300 ml  Net 689.7 ml    LBM: Last BM Date:  (unknown, PTA) Baseline Weight: Weight: 58 kg Most recent weight: Weight: (P) 48.8 kg     Palliative Assessment/Data: PPS 10%    Time Total: 65 minutes Greater than 50%  of this time was spent counseling and coordinating care related to the above assessment and plan.  Juel Burrow, DNP,  AGNP-C Palliative Medicine Team [phone] Pager: [phone]

## 2020-12-28 NOTE — Progress Notes (Signed)
Pt's heart rate sustaining in 130-140's. PRN Metoprolol 5mg  given. Pt is still only responsive to pain at this time, will continue to monitor.

## 2020-12-28 NOTE — Progress Notes (Signed)
Paged provider on call due to low BP and new onset bradycardia. LR bolus was ordered, will give and then recheck VS.  12/28/20 0037  Vitals  Temp 97.7 F (36.5 C)  Temp Source Oral  BP (!) 87/54 (paged on call provider)  MAP (mmHg) (!) 64  BP Location Right Arm  BP Method Automatic  Patient Position (if appropriate) Lying  Pulse Rate (!) 59  Resp 14  MEWS COLOR  MEWS Score Color Yellow  Oxygen Therapy  SpO2 92 %  O2 Device Nasal Cannula  O2 Flow Rate (L/min) 4 L/min  MEWS Score  MEWS Temp 0  MEWS Systolic 1  MEWS Pulse 0  MEWS RR 0  MEWS LOC 2  MEWS Score 3

## 2020-12-28 NOTE — Progress Notes (Signed)
Paged provider on call in regards to low BP. Provider came to bedside but no new orders at this time. Will continue to monitor patient.    12/28/20 0414  Vitals  Temp 97.7 F (36.5 C)  BP (!) 86/46  MAP (mmHg) (!) 60  BP Location Right Arm  BP Method Automatic  Patient Position (if appropriate) Lying  Pulse Rate 62  Pulse Rate Source Monitor  Resp 16  MEWS COLOR  MEWS Score Color Yellow  Oxygen Therapy  SpO2 95 %  O2 Device Nasal Cannula  O2 Flow Rate (L/min) 4 L/min  MEWS Score  MEWS Temp 0  MEWS Systolic 1  MEWS Pulse 0  MEWS RR 0  MEWS LOC 2  MEWS Score 3

## 2020-12-28 NOTE — Progress Notes (Signed)
Paged dr Earleen [place] pt "not looking good" HR sustained 160s, increased work of breathing, hypotensive

## 2020-12-28 NOTE — Progress Notes (Signed)
Patient ID: Tyler Pierce, male   DOB: Apr 27, 1946, 75 y.o.   MRN: [id number] [facility] PROGRESS NOTE  KARSON REEDE WEX:[id number] DOB: [birth year] DOA: 12/25/2020 PCP: Patient, No Pcp Per  HPI/Subjective: Called to see patient this morning that he was having more respiratory distress and fast heart rate.  Patient was placed on BiPAP and transferred to the ICU.  Patient barely responsive.  With the BiPAP he is oxygenating better.  Patient was given morphine and IV metoprolol.  Admitted with severe sepsis.  Objective: Vitals:   12/28/20 0723 12/28/20 0731  BP: 110/81 137/73  Pulse: (!) 162 91  Resp: (!) 35 16  Temp: 98.4 F (36.9 C) 98.5 F (36.9 C)  SpO2: (!) 89% (!) 87%    Intake/Output Summary (Last 24 hours) at 12/28/2020 0816 Last data filed at 12/28/2020 9381 Gross per 24 hour  Intake 1189.7 ml  Output 300 ml  Net 889.7 ml   Filed Weights   12/18/2020 2230 12/27/20 1540  Weight: 58 kg (P) 48.8 kg    ROS: Review of Systems  Unable to perform ROS: Acuity of condition   Exam: Physical Exam HENT:     Head: Normocephalic.     Mouth/Throat:     Pharynx: No oropharyngeal exudate.  Eyes:     General: Lids are normal.     Conjunctiva/sclera: Conjunctivae normal.  Cardiovascular:     Rate and Rhythm: Normal rate and regular rhythm.     Heart sounds: Normal heart sounds, S1 normal and S2 normal.  Pulmonary:     Breath sounds: Examination of the right-lower field reveals decreased breath sounds. Examination of the left-lower field reveals decreased breath sounds. Decreased breath sounds present. No wheezing, rhonchi or rales.  Abdominal:     Palpations: Abdomen is soft.     Tenderness: There is no abdominal tenderness.  Musculoskeletal:     Right ankle: No swelling.     Left ankle: No swelling.  Skin:    General: Skin is warm.     Findings: No rash.  Neurological:     Mental Status: He is lethargic.       Data Reviewed: Basic Metabolic Panel: Recent Labs   Lab 12/13/2020 1857 12/25/2020 2159 12/28/20 0441  NA 157*  --  148*  K 3.6  --  3.3*  CL 125*  --  116*  CO2 20*  --  24  GLUCOSE 125*  --  170*  BUN 61*  --  45*  CREATININE 1.64*  --  0.94  CALCIUM 7.9*  --  8.4*  MG  --  2.5*  --    Liver Function Tests: Recent Labs  Lab 12/17/2020 1857  AST 33  ALT 15  ALKPHOS 51  BILITOT 1.2  PROT 6.0*  ALBUMIN 2.0*   CBC: Recent Labs  Lab 12/27/2020 1857 12/28/20 0441  WBC 12.2* 10.7*  NEUTROABS 9.7*  --   HGB 12.5* 11.1*  HCT 40.2 36.4*  MCV 100.2* 100.6*  PLT 114* 100*     Recent Results (from the past 240 hour(s))  Resp Panel by RT-PCR (Flu A&B, Covid) Nasopharyngeal Swab     Status: None   Collection Time: 12/13/2020  6:57 PM   Specimen: Nasopharyngeal Swab; Nasopharyngeal(NP) swabs in vial transport medium  Result Value Ref Range Status   SARS Coronavirus 2 by RT PCR NEGATIVE NEGATIVE Final    Comment: (NOTE) SARS-CoV-2 target nucleic acids are NOT DETECTED.  The SARS-CoV-2 RNA is generally detectable in upper  respiratory specimens during the acute phase of infection. The lowest concentration of SARS-CoV-2 viral copies this assay can detect is 138 copies/mL. A negative result does not preclude SARS-Cov-2 infection and should not be used as the sole basis for treatment or other patient management decisions. A negative result may occur with  improper specimen collection/handling, submission of specimen other than nasopharyngeal swab, presence of viral mutation(s) within the areas targeted by this assay, and inadequate number of viral copies(<138 copies/mL). A negative result must be combined with clinical observations, patient history, and epidemiological information. The expected result is Negative.  Fact Sheet for Patients:  EntrepreneurPulse.com.au  Fact Sheet for Healthcare Providers:  IncredibleEmployment.be  This test is no t yet approved or cleared by the [place] FDA  and  has been authorized for detection and/or diagnosis of SARS-CoV-2 by FDA under an Emergency Use Authorization (EUA). This EUA will remain  in effect (meaning this test can be used) for the duration of the COVID-19 declaration under Section 564(b)(1) of the Act, 21 U.S.C.section 360bbb-3(b)(1), unless the authorization is terminated  or revoked sooner.       Influenza A by PCR NEGATIVE NEGATIVE Final   Influenza B by PCR NEGATIVE NEGATIVE Final    Comment: (NOTE) The Xpert Xpress SARS-CoV-2/FLU/RSV plus assay is intended as an aid in the diagnosis of influenza from Nasopharyngeal swab specimens and should not be used as a sole basis for treatment. Nasal washings and aspirates are unacceptable for Xpert Xpress SARS-CoV-2/FLU/RSV testing.  Fact Sheet for Patients: EntrepreneurPulse.com.au  Fact Sheet for Healthcare Providers: IncredibleEmployment.be  This test is not yet approved or cleared by the [place] FDA and has been authorized for detection and/or diagnosis of SARS-CoV-2 by FDA under an Emergency Use Authorization (EUA). This EUA will remain in effect (meaning this test can be used) for the duration of the COVID-19 declaration under Section 564(b)(1) of the Act, 21 U.S.C. section 360bbb-3(b)(1), unless the authorization is terminated or revoked.  Performed at [facility], [address] [place], [place] [address]   Blood Culture (routine x 2)     Status: None (Preliminary result)   Collection Time: 12/28/2020  6:57 PM   Specimen: BLOOD  Result Value Ref Range Status   Specimen Description BLOOD BLOOD RIGHT WRIST  Final   Special Requests   Final    BOTTLES DRAWN AEROBIC AND ANAEROBIC Blood Culture results may not be optimal due to an inadequate volume of blood received in culture bottles   Culture   Final    NO GROWTH 2 DAYS Performed at [facility], [address] [place], [place] [address]    Report  Status PENDING  Incomplete  Blood Culture (routine x 2)     Status: None (Preliminary result)   Collection Time: 12/31/2020  6:57 PM   Specimen: BLOOD  Result Value Ref Range Status   Specimen Description BLOOD BLOOD LEFT WRIST  Final   Special Requests   Final    BOTTLES DRAWN AEROBIC AND ANAEROBIC Blood Culture results may not be optimal due to an inadequate volume of blood received in culture bottles   Culture   Final    NO GROWTH 2 DAYS Performed at [facility], [address] [place], [place] [address]    Report Status PENDING  Incomplete  Urine culture     Status: None (Preliminary result)   Collection Time: 12/27/20  4:02 AM   Specimen: In/Out Cath Urine  Result Value Ref Range Status  Specimen Description   Final    IN/OUT CATH URINE Performed at [facility], [address] [place], [place] [address]    Special Requests   Final    NONE Performed at [facility] [address] [place], [place] [address]    Culture   Final    CULTURE REINCUBATED FOR BETTER GROWTH Performed at [facility], [place] [address] [place], [place] [address]    Report Status PENDING  Incomplete     Studies: CT Head Wo Contrast  Result Date: 12/24/2020 CLINICAL DATA:  Mental status change EXAM: CT HEAD WITHOUT CONTRAST TECHNIQUE: Contiguous axial images were obtained from the base of the skull through the vertex without intravenous contrast. COMPARISON:  CT brain 05/28/2020, MRI 11/06/2015 FINDINGS: Brain: Large left convexity extra-axial heterogenous mass measuring approximately 10.5 x 4.7 by 9.3 cm, previously 9.6 by 4.5 x 8.6 cm when measured in a similar fashion on previous exam. Mass slightly dense at its periphery with central low attenuation and scattered calcification. Increased midline shift to the right, measuring 18 mm, previously 15 mm. Persistent severe mass effect on the left lateral ventricle. Subfalcine herniation to the right as before.  Significant local mass effect with surrounding edema as before. Mild left sulcal effacement consistent with swelling. Underlying chronic small vessel ischemic change of the white matter. Redemonstrated chronic lacunar infarcts within the basal ganglia. Vascular: No hyperdense vessels.  Carotid vascular calcification. Skull: No fracture Sinuses/Orbits: No acute finding. Mild mucosal thickening in the sinuses Other: None IMPRESSION: 1. Large left convexity extra-axial heterogenous mass, increased in size compared to most recent prior exam. Persistent severe local mass effect with subfalcine herniation as before; slight increased midline shift to the right, now 18 mm previously 15 mm. No definitive hemorrhage identified. 2. Atrophy and chronic small vessel ischemic changes of the white matter. Chronic lacunar infarcts within the basal ganglia. Critical Value/emergent results were called by telephone at the time of interpretation on 01/01/2021 at 8:13 pm to provider [facility] , who verbally acknowledged these results. Electronically Signed   By: Donavan Foil M.D.   On: 12/24/2020 20:14   DG Chest Port 1 View  Result Date: 12/24/2020 CLINICAL DATA:  Possible sepsis EXAM: PORTABLE CHEST 1 VIEW COMPARISON:  05/28/2020 FINDINGS: The heart size and mediastinal contours are within normal limits. Atherosclerotic calcification of the aortic knob. Streaky left perihilar and left basilar airspace opacity. Right lung is clear. No pleural effusion or pneumothorax. IMPRESSION: Streaky left perihilar and left basilar airspace opacity, suspicious for pneumonia and/or aspiration. Electronically Signed   By: Davina Poke D.O.   On: 12/13/2020 19:53    Scheduled Meds: . dexamethasone (DECADRON) injection  10 mg Intravenous Q8H  . enoxaparin (LOVENOX) injection  40 mg Subcutaneous Q24H   Continuous Infusions: . ceFEPime (MAXIPIME) IV 2 g (12/27/20 2137)  . dextrose 100 mL/hr at 12/28/20 0522  . metronidazole 500 mg  (12/28/20 0315)  . vancomycin 750 mg (12/28/20 0112)    Assessment/Plan:  1. Severe sepsis, present on admission with fever, tachycardia leukocytosis and acute metabolic encephalopathy with lobar pneumonia.  Continue aggressive antibiotics with cefepime Flagyl and vancomycin.  Continue to watch cultures.  Mental status still impaired.  Mentioned the possibility of comfort care to the patient's friend.  He knows that the patient's prognosis is not good with multiple organs involved 2. Acute hypoxic respiratory failure.  Patient was struggling to breathe this morning.  Patient was on [place]s resent nonrebreather and saturating  in the 80s.  Patient placed on BiPAP and transferred to the ICU. 3. 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  CT scan showing slightly more herniation than last time.  Continue Decadron.  Palliative care consultation. 4. Hyponatremia and hypokalemia.  Change IV fluids to D5W plus KCl. 5. Intermittent SVT.  Patient's mental status is not good enough to swallow pills at this time.  As needed metoprolol. 6. Stage III coccyx decubiti.  See description below.  See other decubitus ulcers listed below.  Pressure Injury 12/27/20 Back Lateral;Upper;Left Stage 1 -  Intact skin with non-blanchable redness of a localized area usually over a bony prominence. (Active)  12/27/20 1636  Location: Back  Location Orientation: Lateral;Upper;Left  Staging: Stage 1 -  Intact skin with non-blanchable redness of a localized area usually over a bony prominence.  Wound Description (Comments):   Present on Admission: Yes     Pressure Injury 12/27/20 Coccyx Medial Stage 3 -  Full thickness tissue loss. Subcutaneous fat may be visible but bone, tendon or muscle are NOT exposed. (Active)  12/27/20 1637  Location: Coccyx  Location Orientation: Medial  Staging: Stage 3 -  Full thickness tissue loss. Subcutaneous fat may be visible but bone, tendon or muscle are NOT exposed.  Wound  Description (Comments):   Present on Admission: Yes     Pressure Injury 12/27/20 Heel Right Deep Tissue Pressure Injury - Purple or maroon localized area of discolored intact skin or blood-filled blister due to damage of underlying soft tissue from pressure and/or shear. (Active)  12/27/20 1638  Location: Heel  Location Orientation: Right  Staging: Deep Tissue Pressure Injury - Purple or maroon localized area of discolored intact skin or blood-filled blister due to damage of underlying soft tissue from pressure and/or shear.  Wound Description (Comments):   Present on Admission: Yes     Pressure Injury 12/27/20 Throat Left Deep Tissue Pressure Injury - Purple or maroon localized area of discolored intact skin or blood-filled blister due to damage of underlying soft tissue from pressure and/or shear. blister (Active)  12/27/20 1639  Location: Throat  Location Orientation: Left  Staging: Deep Tissue Pressure Injury - Purple or maroon localized area of discolored intact skin or blood-filled blister due to damage of underlying soft tissue from pressure and/or shear.  Wound Description (Comments): blister  Present on Admission: Yes       Code Status:     Code Status Orders  (From admission, onward)         Start     Ordered   01/08/2021 2213  Do not attempt resuscitation (DNR)  Continuous       Question Answer Comment  In the event of cardiac or respiratory ARREST Do not call a "code blue"   In the event of cardiac or respiratory ARREST Do not perform Intubation, CPR, defibrillation or ACLS   In the event of cardiac or respiratory ARREST Use medication by any route, position, wound care, and other measures to relive pain and suffering. May use oxygen, suction and manual treatment of airway obstruction as needed for comfort.      12/30/2020 2216        Code Status History    Date Active Date Inactive Code Status Order ID Comments User Context   12/28/2020 2038 01/02/2021 2216 DNR  [id number]  Blake Divine, MD ED   01/05/2021 1903 01/02/2021 1909 DNR [id number]  Blake Divine, MD ED   11/06/2015 0349 11/09/2015 2027 Full Code [id number]  Theressa Millard, MD Inpatient   Advance Care Planning Activity     Family Communication: Spoke with friend on the phone.  She will try to come and visit after work today. Disposition Plan: Status is: Inpatient  Dispo: The patient is from: Rehab              Anticipated d/c is to: Rehab              Anticipated d/c date is: To be determined based on clinical course              Patient currently transferred to the ICU for BiPAP   Difficult to place patient.  Hopefully not  Time spent: 28 minutes  [place]

## 2020-12-28 NOTE — Progress Notes (Signed)
Pharmacy Antibiotic Note  Tyler Pierce is a 75 y.o. male admitted on 01/05/2021 with sepsis.  Pharmacy has been consulted for Vanc, Cefepime dosing.  Antibiotics Day 3 - Afebrile last 24 hours, WBC 12.2 > 10.7  Plan:  Continue Vancomycin 750 mg Q24H   Continue Cefepime 2 gram Q12H   Follow up culture results  Vancomycin levels at steady state if warranted   Continue to monitor renal function for safety and possible dose adjustments    Height: (P) 5\' 11"  (180.3 cm) Weight: (P) 48.8 kg (107 lb 8 oz) IBW/kg (Calculated) : (P) 75.3  Temp (24hrs), Avg:98 F (36.7 C), Min:97.5 F (36.4 C), Max:98.5 F (36.9 C)  Recent Labs  Lab 12/23/2020 1857 12/28/20 0441  WBC 12.2* 10.7*  CREATININE 1.64* 0.94  LATICACIDVEN 2.6*  3.8*  --     Estimated Creatinine Clearance: 57.4 mL/min (by C-G formula based on SCr of 0.94 mg/dL).    Allergies  Allergen Reactions  . Penicillins     Loss of consciousness  Has patient had a PCN reaction causing immediate rash, facial/tongue/throat swelling, SOB or lightheadedness with hypotension: Yes Has patient had a PCN reaction causing severe rash involving mucus membranes or skin necrosis: No Has patient had a PCN reaction that required hospitalization No Has patient had a PCN reaction occurring within the last 10 years: No If all of the above answers are "NO", then may proceed with Cephalosporin use.    Antimicrobials this admission:   >>    >>   Dose adjustments this admission: n/a  Microbiology results: 2/14 BCx: NGTD  2/15 UCx:  sent 2/16  MRSA PCR: ordered   Thank you for allowing pharmacy to be a part of this patient's care.  Dorothe Pea, PharmD, BCPS Clinical Pharmacist  12/28/2020 7:35 AM

## 2020-12-29 DIAGNOSIS — C3491 Malignant neoplasm of unspecified part of right bronchus or lung: Secondary | ICD-10-CM | POA: Diagnosis not present

## 2020-12-29 DIAGNOSIS — G935 Compression of brain: Secondary | ICD-10-CM | POA: Diagnosis not present

## 2020-12-29 DIAGNOSIS — J181 Lobar pneumonia, unspecified organism: Secondary | ICD-10-CM | POA: Diagnosis not present

## 2020-12-29 DIAGNOSIS — A419 Sepsis, unspecified organism: Secondary | ICD-10-CM | POA: Diagnosis not present

## 2020-12-29 LAB — URINE CULTURE: Culture: 3000 — AB

## 2020-12-29 MED ORDER — ONDANSETRON 4 MG PO TBDP: 4.0000 mg | ORAL_TABLET | Freq: Four times a day (QID) | ORAL | Status: DC | PRN

## 2020-12-29 MED ORDER — BIOTENE DRY MOUTH MT LIQD: 15.0000 mL | OROMUCOSAL | Status: DC | PRN

## 2020-12-29 MED ORDER — POLYVINYL ALCOHOL 1.4 % OP SOLN
1.0000 [drp] | Freq: Four times a day (QID) | OPHTHALMIC | Status: DC | PRN
  Filled 2020-12-29: qty 15

## 2020-12-29 MED ORDER — GLYCOPYRROLATE 0.2 MG/ML IJ SOLN
0.2000 mg | INTRAMUSCULAR | Status: DC | PRN
  Administered 2020-12-30 (×2): 0.2 mg via INTRAVENOUS
  Filled 2020-12-29 (×2): qty 1

## 2020-12-29 MED ORDER — GLYCOPYRROLATE 0.2 MG/ML IJ SOLN: 0.2000 mg | INTRAMUSCULAR | Status: DC | PRN

## 2020-12-29 MED ORDER — MORPHINE 100MG IN NS 100ML (1MG/ML) PREMIX INFUSION
4.0000 mg/h | INTRAVENOUS | Status: DC
  Administered 2020-12-29 – 2020-12-31 (×3): 3 mg/h via INTRAVENOUS
  Administered 2021-01-02: 05:00:00 4 mg/h via INTRAVENOUS
  Filled 2020-12-29 (×5): qty 100

## 2020-12-29 MED ORDER — HALOPERIDOL 0.5 MG PO TABS
0.5000 mg | ORAL_TABLET | ORAL | Status: DC | PRN
  Filled 2020-12-29: qty 1

## 2020-12-29 MED ORDER — ONDANSETRON HCL 4 MG/2ML IJ SOLN: 4.0000 mg | Freq: Four times a day (QID) | INTRAMUSCULAR | Status: DC | PRN

## 2020-12-29 MED ORDER — ACETAMINOPHEN 325 MG PO TABS: 650.0000 mg | ORAL_TABLET | Freq: Four times a day (QID) | ORAL | Status: DC | PRN

## 2020-12-29 MED ORDER — DEXAMETHASONE SODIUM PHOSPHATE 4 MG/ML IJ SOLN: 4.0000 mg | Freq: Three times a day (TID) | INTRAMUSCULAR | Status: DC

## 2020-12-29 MED ORDER — HALOPERIDOL LACTATE 2 MG/ML PO CONC
0.5000 mg | ORAL | Status: DC | PRN
  Filled 2020-12-29: qty 0.3

## 2020-12-29 MED ORDER — HALOPERIDOL LACTATE 5 MG/ML IJ SOLN: 0.5000 mg | INTRAMUSCULAR | Status: DC | PRN

## 2020-12-29 MED ORDER — ACETAMINOPHEN 650 MG RE SUPP: 650.0000 mg | Freq: Four times a day (QID) | RECTAL | Status: DC | PRN

## 2020-12-29 MED ORDER — GLYCOPYRROLATE 1 MG PO TABS
1.0000 mg | ORAL_TABLET | ORAL | Status: DC | PRN
  Filled 2020-12-29: qty 1

## 2020-12-29 NOTE — Progress Notes (Signed)
Patient ID: Tyler Pierce, male   DOB: 27-Jun-1946, 75 y.o.   MRN: [id number]  Comfort care measures order and started morphine drip.  Discussed with Justice at the bedside.  Dr Leslye Peer

## 2020-12-29 NOTE — Progress Notes (Signed)
Patient ID: Tyler Pierce, male   DOB: November 14, 1945, 75 y.o.   MRN: [id number] [facility] PROGRESS NOTE  Tyler Pierce WNI:[id number] DOB: 03/13/1946 DOA: 12/20/2020 PCP: Patient, No Pcp Per  HPI/Subjective: The patient opens eyes to stimulation today.  Able to squeeze my hands.  Unable to speak at this point.  Currently has BiPAP on.  Admitted with severe sepsis.  Objective: Vitals:   12/29/20 0600 12/29/20 0609  BP: 103/68   Pulse: (!) 142 (!) 137  Resp: 14   Temp:    SpO2: 96%     Intake/Output Summary (Last 24 hours) at 12/29/2020 0812 Last data filed at 12/29/2020 0500 Gross per 24 hour  Intake 1433.89 ml  Output 450 ml  Net 983.89 ml   Filed Weights   12/22/2020 2230 12/27/20 1540  Weight: 58 kg (P) 48.8 kg    ROS: Review of Systems  Unable to perform ROS: Acuity of condition   Exam: Physical Exam HENT:     Head: Normocephalic.     Mouth/Throat:     Comments: Unable to look into mouth Eyes:     General: Lids are normal.     Conjunctiva/sclera: Conjunctivae normal.  Cardiovascular:     Rate and Rhythm: Regular rhythm. Tachycardia present.     Heart sounds: Normal heart sounds, S1 normal and S2 normal.  Pulmonary:     Breath sounds: Examination of the right-lower field reveals decreased breath sounds. Examination of the left-lower field reveals decreased breath sounds. Decreased breath sounds present. No wheezing, rhonchi or rales.  Abdominal:     Palpations: Abdomen is soft.     Tenderness: There is no abdominal tenderness.  Musculoskeletal:     Right lower leg: No swelling.     Left lower leg: No swelling.  Skin:    General: Skin is warm.     Findings: No rash.  Neurological:     Mental Status: He is lethargic.     Comments: Opens eyes to stimulation and able to squeeze slightly with both of his hands to my command.       Data Reviewed: Basic Metabolic Panel: Recent Labs  Lab 01/08/2021 1857 01/02/2021 2159 12/28/20 0441  NA 157*  --  148*  K  3.6  --  3.3*  CL 125*  --  116*  CO2 20*  --  24  GLUCOSE 125*  --  170*  BUN 61*  --  45*  CREATININE 1.64*  --  0.94  CALCIUM 7.9*  --  8.4*  MG  --  2.5*  --    Liver Function Tests: Recent Labs  Lab 12/31/2020 1857  AST 33  ALT 15  ALKPHOS 51  BILITOT 1.2  PROT 6.0*  ALBUMIN 2.0*   No results for input(s): LIPASE, AMYLASE in the last 168 hours. No results for input(s): AMMONIA in the last 168 hours. CBC: Recent Labs  Lab 01/02/2021 1857 12/28/20 0441  WBC 12.2* 10.7*  NEUTROABS 9.7*  --   HGB 12.5* 11.1*  HCT 40.2 36.4*  MCV 100.2* 100.6*  PLT 114* 100*    CBG: Recent Labs  Lab 12/28/20 0803  GLUCAP 164*    Recent Results (from the past 240 hour(s))  Resp Panel by RT-PCR (Flu A&B, Covid) Nasopharyngeal Swab     Status: None   Collection Time: 01/02/2021  6:57 PM   Specimen: Nasopharyngeal Swab; Nasopharyngeal(NP) swabs in vial transport medium  Result Value Ref Range Status   SARS Coronavirus 2 by  RT PCR NEGATIVE NEGATIVE Final    Comment: (NOTE) SARS-CoV-2 target nucleic acids are NOT DETECTED.  The SARS-CoV-2 RNA is generally detectable in upper respiratory specimens during the acute phase of infection. The lowest concentration of SARS-CoV-2 viral copies this assay can detect is 138 copies/mL. A negative result does not preclude SARS-Cov-2 infection and should not be used as the sole basis for treatment or other patient management decisions. A negative result may occur with  improper specimen collection/handling, submission of specimen other than nasopharyngeal swab, presence of viral mutation(s) within the areas targeted by this assay, and inadequate number of viral copies(<138 copies/mL). A negative result must be combined with clinical observations, patient history, and epidemiological information. The expected result is Negative.  Fact Sheet for Patients:  EntrepreneurPulse.com.au  Fact Sheet for Healthcare Providers:   IncredibleEmployment.be  This test is no t yet approved or cleared by the [place] FDA and  has been authorized for detection and/or diagnosis of SARS-CoV-2 by FDA under an Emergency Use Authorization (EUA). This EUA will remain  in effect (meaning this test can be used) for the duration of the COVID-19 declaration under Section 564(b)(1) of the Act, 21 U.S.C.section 360bbb-3(b)(1), unless the authorization is terminated  or revoked sooner.       Influenza A by PCR NEGATIVE NEGATIVE Final   Influenza B by PCR NEGATIVE NEGATIVE Final    Comment: (NOTE) The Xpert Xpress SARS-CoV-2/FLU/RSV plus assay is intended as an aid in the diagnosis of influenza from Nasopharyngeal swab specimens and should not be used as a sole basis for treatment. Nasal washings and aspirates are unacceptable for Xpert Xpress SARS-CoV-2/FLU/RSV testing.  Fact Sheet for Patients: EntrepreneurPulse.com.au  Fact Sheet for Healthcare Providers: IncredibleEmployment.be  This test is not yet approved or cleared by the [place] FDA and has been authorized for detection and/or diagnosis of SARS-CoV-2 by FDA under an Emergency Use Authorization (EUA). This EUA will remain in effect (meaning this test can be used) for the duration of the COVID-19 declaration under Section 564(b)(1) of the Act, 21 U.S.C. section 360bbb-3(b)(1), unless the authorization is terminated or revoked.  Performed at [facility], [address] [place], [place] [address]   Blood Culture (routine x 2)     Status: None (Preliminary result)   Collection Time: 12/30/2020  6:57 PM   Specimen: BLOOD  Result Value Ref Range Status   Specimen Description BLOOD BLOOD RIGHT WRIST  Final   Special Requests   Final    BOTTLES DRAWN AEROBIC AND ANAEROBIC Blood Culture results may not be optimal due to an inadequate volume of blood received in culture bottles   Culture   Final     NO GROWTH 3 DAYS Performed at [facility], [address], [place], [place] [address]    Report Status PENDING  Incomplete  Blood Culture (routine x 2)     Status: None (Preliminary result)   Collection Time: 01/02/2021  6:57 PM   Specimen: BLOOD  Result Value Ref Range Status   Specimen Description BLOOD BLOOD LEFT WRIST  Final   Special Requests   Final    BOTTLES DRAWN AEROBIC AND ANAEROBIC Blood Culture results may not be optimal due to an inadequate volume of blood received in culture bottles   Culture   Final    NO GROWTH 3 DAYS Performed at [facility], [address] [place], [place] [address]    Report Status PENDING  Incomplete  Urine culture  Status: Abnormal   Collection Time: 12/27/20  4:02 AM   Specimen: In/Out Cath Urine  Result Value Ref Range Status   Specimen Description   Final    IN/OUT CATH URINE Performed at [facility], [address] [place], [place] [address]    Special Requests   Final    NONE Performed at [facility], [place], [place] [address]    Culture 3,000 COLONIES/mL ENTEROCOCCUS FAECALIS (A)  Final   Report Status 12/29/2020 FINAL  Final   Organism ID, Bacteria ENTEROCOCCUS FAECALIS (A)  Final      Susceptibility   Enterococcus faecalis - MIC*    AMPICILLIN <=2 SENSITIVE Sensitive     NITROFURANTOIN <=16 SENSITIVE Sensitive     VANCOMYCIN 1 SENSITIVE Sensitive     * 3,000 COLONIES/mL ENTEROCOCCUS FAECALIS  MRSA PCR Screening     Status: None   Collection Time: 12/28/20  7:35 AM   Specimen: Nasopharyngeal  Result Value Ref Range Status   MRSA by PCR NEGATIVE NEGATIVE Final    Comment:        The GeneXpert MRSA Assay (FDA approved for NASAL specimens only), is one component of a comprehensive MRSA colonization surveillance program. It is not intended to diagnose MRSA infection nor to guide or monitor treatment for MRSA infections. Performed at [facility],  [address] [place], [place] [address]      Scheduled Meds: . dexamethasone (DECADRON) injection  10 mg Intravenous Q8H   Continuous Infusions: . dextrose 5 % with KCl 20 mEq / L 75 mL/hr at 12/29/20 0500    Assessment/Plan:  1. Severe sepsis, present on admission with fever, tachycardia, leukocytosis and acute metabolic encephalopathy with lobar pneumonia.  Looks like antibiotics were stopped last evening.  Unable to contact a friend this morning about conversion to full comfort measures.  Nursing staff states that the patient's friend did not come in last night. 2. Acute hypoxic respiratory failure.  Currently on BiPAP.  Yesterday patient had low oxygen saturation in the 80s on 100% nonrebreather. 3. 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  CT scan does show more herniation than last imaging test.  Decrease high-dose Decadron down to lower dose Decadron. 4. Hyponatremia and hypokalemia.  On D5W plus KCl 5. Intermittent SVT on as needed IV metoprolol 6. Stage III coccyx decubiti.  Present on admission.  See full description below 7. Patient is a DNR and overall prognosis is poor  Pressure Injury 12/27/20 Back Lateral;Upper;Left Stage 1 -  Intact skin with non-blanchable redness of a localized area usually over a bony prominence. (Active)  12/27/20 1636  Location: Back  Location Orientation: Lateral;Upper;Left  Staging: Stage 1 -  Intact skin with non-blanchable redness of a localized area usually over a bony prominence.  Wound Description (Comments):   Present on Admission: Yes     Pressure Injury 12/27/20 Coccyx Medial Stage 3 -  Full thickness tissue loss. Subcutaneous fat may be visible but bone, tendon or muscle are NOT exposed. (Active)  12/27/20 1637  Location: Coccyx  Location Orientation: Medial  Staging: Stage 3 -  Full thickness tissue loss. Subcutaneous fat may be visible but bone, tendon or muscle are NOT exposed.  Wound Description (Comments):    Present on Admission: Yes     Pressure Injury 12/27/20 Heel Right Deep Tissue Pressure Injury - Purple or maroon localized area of discolored intact skin or blood-filled blister due to damage of underlying soft tissue  from pressure and/or shear. (Active)  12/27/20 1638  Location: Heel  Location Orientation: Right  Staging: Deep Tissue Pressure Injury - Purple or maroon localized area of discolored intact skin or blood-filled blister due to damage of underlying soft tissue from pressure and/or shear.  Wound Description (Comments):   Present on Admission: Yes     Pressure Injury 12/27/20 Throat Left Deep Tissue Pressure Injury - Purple or maroon localized area of discolored intact skin or blood-filled blister due to damage of underlying soft tissue from pressure and/or shear. blister (Active)  12/27/20 1639  Location: Throat  Location Orientation: Left  Staging: Deep Tissue Pressure Injury - Purple or maroon localized area of discolored intact skin or blood-filled blister due to damage of underlying soft tissue from pressure and/or shear.  Wound Description (Comments): blister  Present on Admission: Yes       Code Status:     Code Status Orders  (From admission, onward)         Start     Ordered   12/31/2020 2213  Do not attempt resuscitation (DNR)  Continuous       Question Answer Comment  In the event of cardiac or respiratory ARREST Do not call a "code blue"   In the event of cardiac or respiratory ARREST Do not perform Intubation, CPR, defibrillation or ACLS   In the event of cardiac or respiratory ARREST Use medication by any route, position, wound care, and other measures to relive pain and suffering. May use oxygen, suction and manual treatment of airway obstruction as needed for comfort.      12/25/2020 2216        Code Status History    Date Active Date Inactive Code Status Order ID Comments User Context   12/22/2020 2038 12/22/2020 2216 DNR [id number]  Blake Divine, MD  ED   01/07/2021 1903 12/30/2020 1909 DNR [id number]  Blake Divine, MD ED   11/06/2015 0349 11/09/2015 2027 Full Code [id number]  Theressa Millard, MD Inpatient   Advance Care Planning Activity     Family Communication: Tried to reach friend this morning but unable to reach and mailbox was full. Disposition Plan: Status is: Inpatient  Dispo: The patient is from: Rehab              Anticipated d/c is to: Rehab versus hospice              Anticipated d/c date is: To be determined based on clinical course, once able to speak with family may convert to full comfort measures              Patient currently medically not ready for discharge   Difficult to place patient.  No  Time spent: 27 minutes  [place]

## 2020-12-29 NOTE — Progress Notes (Signed)
Pt resting comfortably at this time with no signs of pain or discomfort. Respirations are shallow. Morphine drip running at 3mg /hr at this time. Lights dimmed in room with soft music playing in background. Will continue to monitor for pain/comfort throughout the night.

## 2020-12-30 DIAGNOSIS — D696 Thrombocytopenia, unspecified: Secondary | ICD-10-CM

## 2020-12-30 DIAGNOSIS — A419 Sepsis, unspecified organism: Secondary | ICD-10-CM | POA: Diagnosis not present

## 2020-12-30 DIAGNOSIS — G9341 Metabolic encephalopathy: Secondary | ICD-10-CM | POA: Diagnosis not present

## 2020-12-30 DIAGNOSIS — C3491 Malignant neoplasm of unspecified part of right bronchus or lung: Secondary | ICD-10-CM | POA: Diagnosis not present

## 2020-12-30 DIAGNOSIS — Z515 Encounter for palliative care: Secondary | ICD-10-CM

## 2020-12-30 NOTE — Progress Notes (Signed)
Patient ID: Tyler Pierce, male   DOB: November 20, 1945, 75 y.o.   MRN: [id number] [facility] PROGRESS NOTE  ZAVION SLEIGHT UTM:[id number] DOB: 1946-06-03 DOA: 12/27/2020 PCP: Patient, No Pcp Per  HPI/Subjective: Patient made comfort care measures yesterday.  Currently on morphine drip.  Took a big breath when I stimulated him.  Periods of apnea and decreased respirations.  Took a few larger breaths while is in the room.  Admitted with severe sepsis.  Objective: Vitals:   12/30/20 0612 12/30/20 0738  BP:  93/77  Pulse:  (!) 163  Resp: (!) 4 (!) 3  Temp:  (!) 97.4 F (36.3 C)  SpO2:  (!) 81%    Intake/Output Summary (Last 24 hours) at 12/30/2020 0945 Last data filed at 12/29/2020 1800 Gross per 24 hour  Intake 24.2 ml  Output --  Net 24.2 ml   Filed Weights   12/22/2020 2230 12/27/20 1540  Weight: 58 kg (P) 48.8 kg    ROS: Review of Systems  Unable to perform ROS: Acuity of condition   Exam: Physical Exam HENT:     Head: Normocephalic.     Mouth/Throat:     Comments: Mouth dry. Eyes:     General: Lids are normal.     Conjunctiva/sclera: Conjunctivae normal.     Comments: Pupils pinpoint  Cardiovascular:     Rate and Rhythm: Regular rhythm. Bradycardia present.     Heart sounds: Normal heart sounds, S1 normal and S2 normal.  Pulmonary:     Effort: Bradypnea present.     Breath sounds: Decreased air movement present. Examination of the right-middle field reveals decreased breath sounds and rhonchi. Examination of the left-middle field reveals decreased breath sounds and rhonchi. Examination of the right-lower field reveals decreased breath sounds and rhonchi. Examination of the left-lower field reveals decreased breath sounds and rhonchi. Decreased breath sounds and rhonchi present. No wheezing or rales.  Abdominal:     Palpations: Abdomen is soft.     Tenderness: There is no abdominal tenderness.  Musculoskeletal:     Right lower leg: No swelling.     Left lower leg:  No swelling.  Skin:    General: Skin is warm.     Findings: No rash.  Neurological:     Mental Status: He is unresponsive.       Data Reviewed: Basic Metabolic Panel: Recent Labs  Lab 12/18/2020 1857 12/31/2020 2159 12/28/20 0441  NA 157*  --  148*  K 3.6  --  3.3*  CL 125*  --  116*  CO2 20*  --  24  GLUCOSE 125*  --  170*  BUN 61*  --  45*  CREATININE 1.64*  --  0.94  CALCIUM 7.9*  --  8.4*  MG  --  2.5*  --    Liver Function Tests: Recent Labs  Lab 12/16/2020 1857  AST 33  ALT 15  ALKPHOS 51  BILITOT 1.2  PROT 6.0*  ALBUMIN 2.0*   CBC: Recent Labs  Lab 01/07/2021 1857 12/28/20 0441  WBC 12.2* 10.7*  NEUTROABS 9.7*  --   HGB 12.5* 11.1*  HCT 40.2 36.4*  MCV 100.2* 100.6*  PLT 114* 100*    CBG: Recent Labs  Lab 12/28/20 0803  GLUCAP 164*    Recent Results (from the past 240 hour(s))  Resp Panel by RT-PCR (Flu A&B, Covid) Nasopharyngeal Swab     Status: None   Collection Time: 01/09/2021  6:57 PM   Specimen: Nasopharyngeal Swab; Nasopharyngeal(NP)  swabs in vial transport medium  Result Value Ref Range Status   SARS Coronavirus 2 by RT PCR NEGATIVE NEGATIVE Final    Comment: (NOTE) SARS-CoV-2 target nucleic acids are NOT DETECTED.  The SARS-CoV-2 RNA is generally detectable in upper respiratory specimens during the acute phase of infection. The lowest concentration of SARS-CoV-2 viral copies this assay can detect is 138 copies/mL. A negative result does not preclude SARS-Cov-2 infection and should not be used as the sole basis for treatment or other patient management decisions. A negative result may occur with  improper specimen collection/handling, submission of specimen other than nasopharyngeal swab, presence of viral mutation(s) within the areas targeted by this assay, and inadequate number of viral copies(<138 copies/mL). A negative result must be combined with clinical observations, patient history, and epidemiological information. The  expected result is Negative.  Fact Sheet for Patients:  EntrepreneurPulse.com.au  Fact Sheet for Healthcare Providers:  IncredibleEmployment.be  This test is no t yet approved or cleared by the [place] FDA and  has been authorized for detection and/or diagnosis of SARS-CoV-2 by FDA under an Emergency Use Authorization (EUA). This EUA will remain  in effect (meaning this test can be used) for the duration of the COVID-19 declaration under Section 564(b)(1) of the Act, 21 U.S.C.section 360bbb-3(b)(1), unless the authorization is terminated  or revoked sooner.       Influenza A by PCR NEGATIVE NEGATIVE Final   Influenza B by PCR NEGATIVE NEGATIVE Final    Comment: (NOTE) The Xpert Xpress SARS-CoV-2/FLU/RSV plus assay is intended as an aid in the diagnosis of influenza from Nasopharyngeal swab specimens and should not be used as a sole basis for treatment. Nasal washings and aspirates are unacceptable for Xpert Xpress SARS-CoV-2/FLU/RSV testing.  Fact Sheet for Patients: EntrepreneurPulse.com.au  Fact Sheet for Healthcare Providers: IncredibleEmployment.be  This test is not yet approved or cleared by the [place] FDA and has been authorized for detection and/or diagnosis of SARS-CoV-2 by FDA under an Emergency Use Authorization (EUA). This EUA will remain in effect (meaning this test can be used) for the duration of the COVID-19 declaration under Section 564(b)(1) of the Act, 21 U.S.C. section 360bbb-3(b)(1), unless the authorization is terminated or revoked.  Performed at [facility], [address] [place], [place] [address]   Blood Culture (routine x 2)     Status: None (Preliminary result)   Collection Time: 01/07/2021  6:57 PM   Specimen: BLOOD  Result Value Ref Range Status   Specimen Description BLOOD BLOOD RIGHT WRIST  Final   Special Requests   Final    BOTTLES DRAWN  AEROBIC AND ANAEROBIC Blood Culture results may not be optimal due to an inadequate volume of blood received in culture bottles   Culture   Final    NO GROWTH 4 DAYS Performed at [facility], [address] [place], [place] [address]    Report Status PENDING  Incomplete  Blood Culture (routine x 2)     Status: None (Preliminary result)   Collection Time: 01/05/2021  6:57 PM   Specimen: BLOOD  Result Value Ref Range Status   Specimen Description BLOOD BLOOD LEFT WRIST  Final   Special Requests   Final    BOTTLES DRAWN AEROBIC AND ANAEROBIC Blood Culture results may not be optimal due to an inadequate volume of blood received in culture bottles   Culture   Final    NO GROWTH 4 DAYS Performed at [facility], [address] [place], [place] [address]    Report Status PENDING  Incomplete  Urine culture     Status: Abnormal   Collection Time: 12/27/20  4:02 AM   Specimen: In/Out Cath Urine  Result Value Ref Range Status   Specimen Description   Final    IN/OUT CATH URINE Performed at [facility], [address] [place], [place] [address]    Special Requests   Final    NONE Performed at [facility], [place], [place] [address]    Culture 3,000 COLONIES/mL ENTEROCOCCUS FAECALIS (A)  Final   Report Status 12/29/2020 FINAL  Final   Organism ID, Bacteria ENTEROCOCCUS FAECALIS (A)  Final      Susceptibility   Enterococcus faecalis - MIC*    AMPICILLIN <=2 SENSITIVE Sensitive     NITROFURANTOIN <=16 SENSITIVE Sensitive     VANCOMYCIN 1 SENSITIVE Sensitive     * 3,000 COLONIES/mL ENTEROCOCCUS FAECALIS  MRSA PCR Screening     Status: None   Collection Time: 12/28/20  7:35 AM   Specimen: Nasopharyngeal  Result Value Ref Range Status   MRSA by PCR NEGATIVE NEGATIVE Final    Comment:        The GeneXpert MRSA Assay (FDA approved for NASAL specimens only), is one component of a comprehensive MRSA colonization surveillance  program. It is not intended to diagnose MRSA infection nor to guide or monitor treatment for MRSA infections. Performed at [facility], [address] [place], [place] [address]       Scheduled Meds: Continuous Infusions: . morphine 3 mg/hr (12/29/20 0955)    Assessment/Plan:  1. End-of-life care.  Patient with periods of apnea and decreased respirations.  Comfort care measures.  Continue morphine drip at current rate. 2. Severe sepsis, present on admission with tachypnea leukocytosis acute metabolic encephalopathy and lobar pneumonia.  Antibiotics stopped and comfort care. 3. Acute hypoxic respiratory failure.  The patient required BiPAP on hospital day 2.  Now on room air. 4. 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  CT scan showing brain herniation.  Initially started on Decadron but that was stopped with comfort care measures. 5. Hypernatremia and hypokalemia 6. Intermittent SVT 7. 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  See full description below 8. Thrombocytopenia secondary to severe sepsis.  Pressure Injury 12/27/20 Back Lateral;Upper;Left Stage 1 -  Intact skin with non-blanchable redness of a localized area usually over a bony prominence. (Active)  12/27/20 1636  Location: Back  Location Orientation: Lateral;Upper;Left  Staging: Stage 1 -  Intact skin with non-blanchable redness of a localized area usually over a bony prominence.  Wound Description (Comments):   Present on Admission: Yes     Pressure Injury 12/27/20 Coccyx Medial Stage 3 -  Full thickness tissue loss. Subcutaneous fat may be visible but bone, tendon or muscle are NOT exposed. (Active)  12/27/20 1637  Location: Coccyx  Location Orientation: Medial  Staging: Stage 3 -  Full thickness tissue loss. Subcutaneous fat may be visible but bone, tendon or muscle are NOT exposed.  Wound Description (Comments):   Present on Admission: Yes     Pressure Injury  12/27/20 Heel Right Deep Tissue Pressure Injury - Purple or maroon localized area of discolored intact skin or blood-filled blister due to damage of underlying soft tissue from pressure and/or shear. (Active)  12/27/20 1638  Location: Heel  Location Orientation: Right  Staging: Deep Tissue Pressure Injury - Purple or maroon localized  area of discolored intact skin or blood-filled blister due to damage of underlying soft tissue from pressure and/or shear.  Wound Description (Comments):   Present on Admission: Yes     Pressure Injury 12/27/20 Throat Left Deep Tissue Pressure Injury - Purple or maroon localized area of discolored intact skin or blood-filled blister due to damage of underlying soft tissue from pressure and/or shear. blister (Active)  12/27/20 1639  Location: Throat  Location Orientation: Left  Staging: Deep Tissue Pressure Injury - Purple or maroon localized area of discolored intact skin or blood-filled blister due to damage of underlying soft tissue from pressure and/or shear.  Wound Description (Comments): blister  Present on Admission: Yes       Code Status:     Code Status Orders  (From admission, onward)         Start     Ordered   12/29/20 0938  Do not attempt resuscitation (DNR)  Continuous       Question Answer Comment  In the event of cardiac or respiratory ARREST Do not call a "code blue"   In the event of cardiac or respiratory ARREST Do not perform Intubation, CPR, defibrillation or ACLS   In the event of cardiac or respiratory ARREST Use medication by any route, position, wound care, and other measures to relive pain and suffering. May use oxygen, suction and manual treatment of airway obstruction as needed for comfort.   Comments nurse may pronounce      12/29/20 0938        Code Status History    Date Active Date Inactive Code Status Order ID Comments User Context   12/20/2020 2216 12/29/2020 0938 DNR [id number]  Criss Alvine, DO ED   12/29/2020 2038  01/09/2021 2216 DNR [id number]  Blake Divine, MD ED   12/18/2020 1903 12/28/2020 1909 DNR [id number]  Blake Divine, MD ED   11/06/2015 0349 11/09/2015 2027 Full Code [id number]  Theressa Millard, MD Inpatient   Advance Care Planning Activity     Disposition Plan: Status is: Inpatient  Dispo: The patient is from: Rehab              Anticipated d/c is to: End-of-life care here in the hospital              Anticipated d/c date is: End-of-life care here in the hospital              Patient currently receiving comfort care measures here in the hospital for end-of-life care    Time spent: 25 minutes, case discussed with transitional care team.  Loletha Grayer  [facility]

## 2020-12-30 NOTE — Progress Notes (Signed)
Nutrition Brief Note  Chart reviewed. Patient has transitioned to comfort care.   No nutrition interventions warranted at this time. Please consult as needed.   Jacklynn Barnacle, MS, RD, LDN Pager number available on Amion

## 2020-12-30 NOTE — Progress Notes (Signed)
Patient actively dying, appears without any distress or suffering.  MS gtt at 3mg /hr infusing.  RR 3/minute, very faint peripheral pulses, mottling of lower extremities.  Expect prognosis to be hours therefore hospital death will occur.  Comfort care to continue as ordered.  Please call primary with any symptom management issues.  Robinul is ordered if respiratory secretions become an issue with comfort.  Kizzie Fantasia, MSN, RN-BC, [facility], [place]

## 2020-12-30 NOTE — TOC Initial Note (Signed)
Transition of Care [facility]) - Initial/Assessment Note    Patient Details  Name: Tyler Pierce MRN: [id number] Date of Birth: 1946/06/17  Transition of [facility]) CM/SW Contact:    Shelbie Hutching, RN Phone Number: 12/30/2020, 10:26 AM  Clinical Narrative:                 Patient is comfort care on morphine drip.  Anticipated hospital death.         Patient Goals and CMS Choice        Expected Discharge Plan and Services                                                Prior Living Arrangements/Services                       Activities of Daily Living      Permission Sought/Granted                  Emotional Assessment              Admission diagnosis:  Brain mass [G93.89] Sepsis ([place]) [A41.9] Severe sepsis ([place]) [A41.9, R65.20] Aspiration pneumonia of left lung, unspecified aspiration pneumonia type, unspecified part of lung ([place]) [J69.0] Sepsis with acute renal failure, due to unspecified organism, unspecified acute renal failure type, unspecified whether septic shock present ([place]) [A41.9, R65.20, N17.9] Patient Active Problem List   Diagnosis Date Noted  . End of life care   . Pressure injury of skin 12/28/2020  . Acute metabolic encephalopathy   . SVT (supraventricular tachycardia) ([place])   . Lobar pneumonia ([place])   . Hypernatremia   . 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 Brain herniation ([place])   . Hx of completed stroke   . Sepsis ([place]) 12/19/2020  . Adenocarcinoma of right lung metastatic to liver ([place]) 12/24/2020  . Severe sepsis ([place]) 12/29/2020  . HLD (hyperlipidemia)   . Tobacco abuse   . Hemiparesis affecting left side as late effect of stroke ([place])   . 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 Dysphagia as late effect of cerebrovascular disease   . Left-sided weakness 11/06/2015  . Cerebral infarction ([place]) 11/06/2015  . Fall 11/06/2015  . Hypokalemia 11/06/2015  . Thrombocytopenia ([place]) 11/06/2015  . Alcohol  abuse 11/06/2015   PCP:  Patient, No Pcp Per Pharmacy:  No Pharmacies Listed    Social Determinants of Health (SDOH) Interventions    Readmission Risk Interventions No flowsheet data found.

## 2020-12-30 NOTE — Care Management Important Message (Signed)
Important Message  Patient Details  Name: Tyler Pierce MRN: [id number] Date of Birth: 1946/04/20   Medicare Important Message Given:  Other (see comment)  Patient is on comfort care and out of respect for the patient and family no Important Message from [facility] given.  Juliann Pulse A Allmond 12/30/2020, 8:02 AM

## 2020-12-31 DIAGNOSIS — J181 Lobar pneumonia, unspecified organism: Secondary | ICD-10-CM | POA: Diagnosis not present

## 2020-12-31 DIAGNOSIS — J9601 Acute respiratory failure with hypoxia: Secondary | ICD-10-CM | POA: Diagnosis not present

## 2020-12-31 DIAGNOSIS — Z515 Encounter for palliative care: Secondary | ICD-10-CM | POA: Diagnosis not present

## 2020-12-31 DIAGNOSIS — A419 Sepsis, unspecified organism: Secondary | ICD-10-CM | POA: Diagnosis not present

## 2020-12-31 LAB — CULTURE, BLOOD (ROUTINE X 2)
Culture: NO GROWTH
Culture: NO GROWTH

## 2020-12-31 NOTE — Progress Notes (Signed)
Patient ID: Tyler Pierce, male   DOB: [birth year], 75 y.o.   MRN: [id number] [facility] PROGRESS NOTE  VANESSA ALESI RCV:[id number] DOB: November 18, 1945 DOA: 12/25/2020 PCP: Patient, No Pcp Per  HPI/Subjective: When palpating the patient's abdomen he does breathe a little bit quicker.  Patient admitted with severe sepsis and pneumonia and converted over to comfort care on morphine drip.  Objective: Vitals:   12/30/20 0738 12/31/20 0515  BP: 93/77 95/67  Pulse: (!) 163 91  Resp: (!) 3 (!) 4  Temp: (!) 97.4 F (36.3 C) 97.8 F (36.6 C)  SpO2: (!) 81% (!) 82%    Intake/Output Summary (Last 24 hours) at 12/31/2020 0801 Last data filed at 12/31/2020 0630 Gross per 24 hour  Intake 109.48 ml  Output --  Net 109.48 ml   Filed Weights   12/23/2020 2230 12/27/20 1540  Weight: 58 kg (P) 48.8 kg    ROS: Review of Systems  Unable to perform ROS: Acuity of condition   Exam: Physical Exam HENT:     Head: Normocephalic.     Mouth/Throat:     Pharynx: No oropharyngeal exudate.  Eyes:     General: Lids are normal.     Conjunctiva/sclera: Conjunctivae normal.     Comments: Pupils pinpoint  Cardiovascular:     Rate and Rhythm: Normal rate and regular rhythm.     Heart sounds: Normal heart sounds, S1 normal and S2 normal.  Pulmonary:     Effort: Bradypnea present.     Breath sounds: Examination of the right-middle field reveals decreased breath sounds. Examination of the left-middle field reveals decreased breath sounds. Examination of the right-lower field reveals decreased breath sounds and rhonchi. Examination of the left-lower field reveals decreased breath sounds and rhonchi. Decreased breath sounds and rhonchi present. No wheezing or rales.  Abdominal:     Palpations: Abdomen is soft.     Tenderness: There is no abdominal tenderness.  Musculoskeletal:     Right ankle: No swelling.     Left ankle: No swelling.  Skin:    General: Skin is warm.     Findings: No rash.   Neurological:     Mental Status: He is lethargic.     Comments: When stimulated starts breathing a little faster.       Data Reviewed: Basic Metabolic Panel: Recent Labs  Lab 12/21/2020 1857 12/20/2020 2159 12/28/20 0441  NA 157*  --  148*  K 3.6  --  3.3*  CL 125*  --  116*  CO2 20*  --  24  GLUCOSE 125*  --  170*  BUN 61*  --  45*  CREATININE 1.64*  --  0.94  CALCIUM 7.9*  --  8.4*  MG  --  2.5*  --    Liver Function Tests: Recent Labs  Lab 12/24/2020 1857  AST 33  ALT 15  ALKPHOS 51  BILITOT 1.2  PROT 6.0*  ALBUMIN 2.0*   CBC: Recent Labs  Lab 01/02/2021 1857 12/28/20 0441  WBC 12.2* 10.7*  NEUTROABS 9.7*  --   HGB 12.5* 11.1*  HCT 40.2 36.4*  MCV 100.2* 100.6*  PLT 114* 100*    Scheduled Meds: Continuous Infusions: . morphine 3 mg/hr (12/31/20 0630)    Assessment/Plan:  1. End-of-life care.  Patient with increased respirations when stimulated.  Periods of apnea and decreased respirations when not stimulated.  Continue comfort care measures.  As needed medications plus morphine drip at 3 mg/h. 2. Severe sepsis, present on  admission with tachypnea and leukocytosis and acute metabolic encephalopathy and lobar pneumonia.  Patient was initially given antibiotics and then stopped once made comfort care. 3. Acute hypoxic respiratory failure.  The patient initially required BiPAP.  Currently pulse ox 82% on room air. 4. 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  Initially was on Decadron but stopped once comfort measures started. 5. Hypernatremia and hypokalemia 6. Intermittent SVT 7. Thrombocytopenia 8. Stage III decubiti on the coccyx, present on admission.  Described below.  Pressure Injury 12/27/20 Back Lateral;Upper;Left Stage 1 -  Intact skin with non-blanchable redness of a localized area usually over a bony prominence. (Active)  12/27/20 1636  Location: Back  Location Orientation: Lateral;Upper;Left  Staging:  Stage 1 -  Intact skin with non-blanchable redness of a localized area usually over a bony prominence.  Wound Description (Comments):   Present on Admission: Yes     Pressure Injury 12/27/20 Coccyx Medial Stage 3 -  Full thickness tissue loss. Subcutaneous fat may be visible but bone, tendon or muscle are NOT exposed. (Active)  12/27/20 1637  Location: Coccyx  Location Orientation: Medial  Staging: Stage 3 -  Full thickness tissue loss. Subcutaneous fat may be visible but bone, tendon or muscle are NOT exposed.  Wound Description (Comments):   Present on Admission: Yes     Pressure Injury 12/27/20 Heel Right Deep Tissue Pressure Injury - Purple or maroon localized area of discolored intact skin or blood-filled blister due to damage of underlying soft tissue from pressure and/or shear. (Active)  12/27/20 1638  Location: Heel  Location Orientation: Right  Staging: Deep Tissue Pressure Injury - Purple or maroon localized area of discolored intact skin or blood-filled blister due to damage of underlying soft tissue from pressure and/or shear.  Wound Description (Comments):   Present on Admission: Yes     Pressure Injury 12/27/20 Throat Left Deep Tissue Pressure Injury - Purple or maroon localized area of discolored intact skin or blood-filled blister due to damage of underlying soft tissue from pressure and/or shear. blister (Active)  12/27/20 1639  Location: Throat  Location Orientation: Left  Staging: Deep Tissue Pressure Injury - Purple or maroon localized area of discolored intact skin or blood-filled blister due to damage of underlying soft tissue from pressure and/or shear.  Wound Description (Comments): blister  Present on Admission: Yes       Code Status:     Code Status Orders  (From admission, onward)         Start     Ordered   12/29/20 0938  Do not attempt resuscitation (DNR)  Continuous       Question Answer Comment  In the event of cardiac or respiratory ARREST Do  not call a "code blue"   In the event of cardiac or respiratory ARREST Do not perform Intubation, CPR, defibrillation or ACLS   In the event of cardiac or respiratory ARREST Use medication by any route, position, wound care, and other measures to relive pain and suffering. May use oxygen, suction and manual treatment of airway obstruction as needed for comfort.   Comments nurse may pronounce      12/29/20 0938        Code Status History    Date Active Date Inactive Code Status Order ID Comments User Context   12/30/2020 2216 12/29/2020 0938 DNR [id number]  Criss Alvine, DO ED   01/07/2021 2038 12/27/2020 2216 DNR [id number]  Blake Divine, MD ED  12/18/2020 1903 12/31/2020 1909 DNR [id number]  Blake Divine, MD ED   11/06/2015 0349 11/09/2015 2027 Full Code [id number]  Theressa Millard, MD Inpatient   Advance Care Planning Activity     Family Communication: Spoke with Justice on the phone Disposition Plan: Status is: Inpatient  Dispo: The patient is from: Rehab              Anticipated d/c is to: Comfort care measures here in the hospital              Anticipated d/c date is: Comfort care measures here in the hospital              Patient currently on morphine drip receiving comfort care measures in the hospital    Time spent: 22 minutes, case discussed with nursing staff  Loletha Grayer  [facility]

## 2021-01-01 DIAGNOSIS — J9601 Acute respiratory failure with hypoxia: Secondary | ICD-10-CM | POA: Diagnosis not present

## 2021-01-01 DIAGNOSIS — A419 Sepsis, unspecified organism: Secondary | ICD-10-CM | POA: Diagnosis not present

## 2021-01-01 DIAGNOSIS — Z515 Encounter for palliative care: Secondary | ICD-10-CM | POA: Diagnosis not present

## 2021-01-01 DIAGNOSIS — G9341 Metabolic encephalopathy: Secondary | ICD-10-CM | POA: Diagnosis not present

## 2021-01-01 NOTE — Progress Notes (Signed)
Patient ID: Tyler Pierce, male   DOB: 07-21-1946, 75 y.o.   MRN: [id number] [facility] PROGRESS NOTE  Tyler Pierce:[id number] DOB: 1946-11-04 DOA: 12/28/2020 PCP: Patient, No Pcp Per  HPI/Subjective: Patient opens eyes to stimulation, able to squeeze a little bit with his right hand.  Unable to speak.  Patient currently on comfort measures on morphine drip.  Objective: Vitals:   01/01/21 1152 01/01/21 1155  BP: 114/77   Pulse: (!) 165 82  Resp: (!) 8   Temp: (!) 97.4 F (36.3 C)   SpO2: (!) 73% (!) 88%    Intake/Output Summary (Last 24 hours) at 01/01/2021 1203 Last data filed at 01/01/2021 0659 Gross per 24 hour  Intake 70.48 ml  Output 101 ml  Net -30.52 ml   Filed Weights   12/28/2020 2230 12/27/20 1540  Weight: 58 kg (P) 48.8 kg    ROS: Review of Systems  Unable to perform ROS: Acuity of condition   Exam: Physical Exam HENT:     Head: Normocephalic.     Mouth/Throat:     Pharynx: No oropharyngeal exudate.  Eyes:     General: Lids are normal.     Conjunctiva/sclera: Conjunctivae normal.  Cardiovascular:     Rate and Rhythm: Normal rate and regular rhythm.     Heart sounds: Normal heart sounds, S1 normal and S2 normal.  Pulmonary:     Breath sounds: Examination of the right-lower field reveals decreased breath sounds and rhonchi. Examination of the left-lower field reveals decreased breath sounds and rhonchi. Decreased breath sounds and rhonchi present. No wheezing or rales.  Abdominal:     Palpations: Abdomen is soft.     Tenderness: There is no abdominal tenderness.  Musculoskeletal:     Right ankle: Swelling present.     Left ankle: Swelling present.  Skin:    General: Skin is warm.     Findings: No rash.  Neurological:     Mental Status: He is lethargic.     Comments: Tries to open eyes with stimulation.  Able to squeeze slightly with his right hand to command.       Data Reviewed: Basic Metabolic Panel: Recent Labs  Lab  12/18/2020 1857 12/27/2020 2159 12/28/20 0441  NA 157*  --  148*  K 3.6  --  3.3*  CL 125*  --  116*  CO2 20*  --  24  GLUCOSE 125*  --  170*  BUN 61*  --  45*  CREATININE 1.64*  --  0.94  CALCIUM 7.9*  --  8.4*  MG  --  2.5*  --    Liver Function Tests: Recent Labs  Lab 12/27/2020 1857  AST 33  ALT 15  ALKPHOS 51  BILITOT 1.2  PROT 6.0*  ALBUMIN 2.0*   CBC: Recent Labs  Lab 12/30/2020 1857 12/28/20 0441  WBC 12.2* 10.7*  NEUTROABS 9.7*  --   HGB 12.5* 11.1*  HCT 40.2 36.4*  MCV 100.2* 100.6*  PLT 114* 100*    CBG: Recent Labs  Lab 12/28/20 0803  GLUCAP 164*    Recent Results (from the past 240 hour(s))  Resp Panel by RT-PCR (Flu A&B, Covid) Nasopharyngeal Swab     Status: None   Collection Time: 12/19/2020  6:57 PM   Specimen: Nasopharyngeal Swab; Nasopharyngeal(NP) swabs in vial transport medium  Result Value Ref Range Status   SARS Coronavirus 2 by RT PCR NEGATIVE NEGATIVE Final    Comment: (NOTE) SARS-CoV-2 target nucleic acids  are NOT DETECTED.  The SARS-CoV-2 RNA is generally detectable in upper respiratory specimens during the acute phase of infection. The lowest concentration of SARS-CoV-2 viral copies this assay can detect is 138 copies/mL. A negative result does not preclude SARS-Cov-2 infection and should not be used as the sole basis for treatment or other patient management decisions. A negative result may occur with  improper specimen collection/handling, submission of specimen other than nasopharyngeal swab, presence of viral mutation(s) within the areas targeted by this assay, and inadequate number of viral copies(<138 copies/mL). A negative result must be combined with clinical observations, patient history, and epidemiological information. The expected result is Negative.  Fact Sheet for Patients:  EntrepreneurPulse.com.au  Fact Sheet for Healthcare Providers:  IncredibleEmployment.be  This test is no  t yet approved or cleared by the [place] FDA and  has been authorized for detection and/or diagnosis of SARS-CoV-2 by FDA under an Emergency Use Authorization (EUA). This EUA will remain  in effect (meaning this test can be used) for the duration of the COVID-19 declaration under Section 564(b)(1) of the Act, 21 U.S.C.section 360bbb-3(b)(1), unless the authorization is terminated  or revoked sooner.       Influenza A by PCR NEGATIVE NEGATIVE Final   Influenza B by PCR NEGATIVE NEGATIVE Final    Comment: (NOTE) The Xpert Xpress SARS-CoV-2/FLU/RSV plus assay is intended as an aid in the diagnosis of influenza from Nasopharyngeal swab specimens and should not be used as a sole basis for treatment. Nasal washings and aspirates are unacceptable for Xpert Xpress SARS-CoV-2/FLU/RSV testing.  Fact Sheet for Patients: EntrepreneurPulse.com.au  Fact Sheet for Healthcare Providers: IncredibleEmployment.be  This test is not yet approved or cleared by the [place] FDA and has been authorized for detection and/or diagnosis of SARS-CoV-2 by FDA under an Emergency Use Authorization (EUA). This EUA will remain in effect (meaning this test can be used) for the duration of the COVID-19 declaration under Section 564(b)(1) of the Act, 21 U.S.C. section 360bbb-3(b)(1), unless the authorization is terminated or revoked.  Performed at [facility], [address] [place], [place] [address]   Blood Culture (routine x 2)     Status: None   Collection Time: 12/13/2020  6:57 PM   Specimen: BLOOD  Result Value Ref Range Status   Specimen Description BLOOD BLOOD RIGHT WRIST  Final   Special Requests   Final    BOTTLES DRAWN AEROBIC AND ANAEROBIC Blood Culture results may not be optimal due to an inadequate volume of blood received in culture bottles   Culture   Final    NO GROWTH 5 DAYS Performed at [facility], [address]  [place], [place] [address]    Report Status 12/31/2020 FINAL  Final  Blood Culture (routine x 2)     Status: None   Collection Time: 01/08/2021  6:57 PM   Specimen: BLOOD  Result Value Ref Range Status   Specimen Description BLOOD BLOOD LEFT WRIST  Final   Special Requests   Final    BOTTLES DRAWN AEROBIC AND ANAEROBIC Blood Culture results may not be optimal due to an inadequate volume of blood received in culture bottles   Culture   Final    NO GROWTH 5 DAYS Performed at [facility], [address], [place], [place] [address]    Report Status 12/31/2020 FINAL  Final  Urine culture     Status: Abnormal   Collection Time: 12/27/20  4:02 AM   Specimen: In/Out  Cath Urine  Result Value Ref Range Status   Specimen Description   Final    IN/OUT CATH URINE Performed at [facility], [address] [place], [place] [address]    Special Requests   Final    NONE Performed at [facility], [place], [place] [address]    Culture 3,000 COLONIES/mL ENTEROCOCCUS FAECALIS (A)  Final   Report Status 12/29/2020 FINAL  Final   Organism ID, Bacteria ENTEROCOCCUS FAECALIS (A)  Final      Susceptibility   Enterococcus faecalis - MIC*    AMPICILLIN <=2 SENSITIVE Sensitive     NITROFURANTOIN <=16 SENSITIVE Sensitive     VANCOMYCIN 1 SENSITIVE Sensitive     * 3,000 COLONIES/mL ENTEROCOCCUS FAECALIS  MRSA PCR Screening     Status: None   Collection Time: 12/28/20  7:35 AM   Specimen: Nasopharyngeal  Result Value Ref Range Status   MRSA by PCR NEGATIVE NEGATIVE Final    Comment:        The GeneXpert MRSA Assay (FDA approved for NASAL specimens only), is one component of a comprehensive MRSA colonization surveillance program. It is not intended to diagnose MRSA infection nor to guide or monitor treatment for MRSA infections. Performed at [facility], [address] [place], [place] [address]       Scheduled Meds: Continuous  Infusions: . morphine 3 mg/hr (12/31/20 2222)    Assessment/Plan:  1. End-of-life care.  Increase morphine drip to 4 mg/h.  Still breathing at about 4/min and more when stimulated.  Continue full comfort care measures.  As needed medications. 2. Severe sepsis, present on admission with lobar pneumonia acute metabolic encephalopathy tachypnea and leukocytosis.  Antibiotics stopped once made comfort care 3. Acute hypoxic respiratory failure.  Patient is still hypoxic with pulse ox of 88% on room air currently. 4. 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 5. Hypernatremia and hypokalemia 6. Intermittent SVT 7. Thrombocytopenia 8. 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  Please see description of other decubiti.  Pressure Injury 12/27/20 Back Lateral;Upper;Left Stage 1 -  Intact skin with non-blanchable redness of a localized area usually over a bony prominence. (Active)  12/27/20 1636  Location: Back  Location Orientation: Lateral;Upper;Left  Staging: Stage 1 -  Intact skin with non-blanchable redness of a localized area usually over a bony prominence.  Wound Description (Comments):   Present on Admission: Yes     Pressure Injury 12/27/20 Coccyx Medial Stage 3 -  Full thickness tissue loss. Subcutaneous fat may be visible but bone, tendon or muscle are NOT exposed. (Active)  12/27/20 1637  Location: Coccyx  Location Orientation: Medial  Staging: Stage 3 -  Full thickness tissue loss. Subcutaneous fat may be visible but bone, tendon or muscle are NOT exposed.  Wound Description (Comments):   Present on Admission: Yes     Pressure Injury 12/27/20 Heel Right Deep Tissue Pressure Injury - Purple or maroon localized area of discolored intact skin or blood-filled blister due to damage of underlying soft tissue from pressure and/or shear. (Active)  12/27/20 1638  Location: Heel  Location Orientation: Right   Staging: Deep Tissue Pressure Injury - Purple or maroon localized area of discolored intact skin or blood-filled blister due to damage of underlying soft tissue from pressure and/or shear.  Wound Description (Comments):   Present on Admission: Yes     Pressure Injury 12/27/20 Throat Left  Deep Tissue Pressure Injury - Purple or maroon localized area of discolored intact skin or blood-filled blister due to damage of underlying soft tissue from pressure and/or shear. blister (Active)  12/27/20 1639  Location: Throat  Location Orientation: Left  Staging: Deep Tissue Pressure Injury - Purple or maroon localized area of discolored intact skin or blood-filled blister due to damage of underlying soft tissue from pressure and/or shear.  Wound Description (Comments): blister  Present on Admission: Yes       Code Status:     Code Status Orders  (From admission, onward)         Start     Ordered   12/29/20 0938  Do not attempt resuscitation (DNR)  Continuous       Question Answer Comment  In the event of cardiac or respiratory ARREST Do not call a "code blue"   In the event of cardiac or respiratory ARREST Do not perform Intubation, CPR, defibrillation or ACLS   In the event of cardiac or respiratory ARREST Use medication by any route, position, wound care, and other measures to relive pain and suffering. May use oxygen, suction and manual treatment of airway obstruction as needed for comfort.   Comments nurse may pronounce      12/29/20 0938        Code Status History    Date Active Date Inactive Code Status Order ID Comments User Context   12/20/2020 2216 12/29/2020 0938 DNR [id number]  Criss Alvine, DO ED   01/07/2021 2038 01/06/2021 2216 DNR [id number]  Blake Divine, MD ED   12/21/2020 1903 01/08/2021 1909 DNR [id number]  Blake Divine, MD ED   11/06/2015 0349 11/09/2015 2027 Full Code [id number]  Theressa Millard, MD Inpatient   Advance Care Planning Activity     Family  Communication: Spoke with Justice on the phone Disposition Plan: Status is: Inpatient  Dispo: The patient is from: Rehab              Anticipated d/c is to: Comfort care measures here in the hospital              Anticipated d/c date is: Comfort care measures here in the hospital              Patient currently comfort care measures here in the hospital    Time spent: 24 minutes  [place]

## 2021-01-02 DIAGNOSIS — J9601 Acute respiratory failure with hypoxia: Secondary | ICD-10-CM | POA: Diagnosis not present

## 2021-01-02 DIAGNOSIS — A419 Sepsis, unspecified organism: Principal | ICD-10-CM

## 2021-01-02 DIAGNOSIS — R6520 Severe sepsis without septic shock: Secondary | ICD-10-CM

## 2021-01-02 DIAGNOSIS — Z515 Encounter for palliative care: Secondary | ICD-10-CM | POA: Diagnosis not present

## 2021-01-02 DIAGNOSIS — C3491 Malignant neoplasm of unspecified part of right bronchus or lung: Secondary | ICD-10-CM

## 2021-01-02 DIAGNOSIS — C787 Secondary malignant neoplasm of liver and intrahepatic bile duct: Secondary | ICD-10-CM

## 2021-01-02 MED ORDER — LORAZEPAM 2 MG/ML IJ SOLN: 1.0000 mg | INTRAMUSCULAR | Status: DC | PRN

## 2021-01-02 NOTE — Progress Notes (Signed)
[facility]) Hospital Liaison RN note:  Received request from Quinn Axe, NP and Doran Clay, [facility] for family interest in [place]. Chart reviewed and eligibility is pending.   Unfortunately, [facility] not have a bed to offer today. Hospital care team is aware.  Please call with any hospice related questions or concerns.  Thank you for the opportunity to participate in this patient's care.  Zandra Abts, RN [facility] Liaison [phone]

## 2021-01-02 NOTE — Progress Notes (Signed)
Patient ID: Tyler Pierce, male   DOB: Jul 13, 1946, 75 y.o.   MRN: [id number] [facility] PROGRESS NOTE  Tyler Pierce QPY:[id number] DOB: Jul 29, 1946 DOA: 12/13/2020 PCP: Patient, No Pcp Per  HPI/Subjective: Patient breathing comfortably on morphine drip.  Admitted with severe sepsis.  Currently on morphine drip and comfort care measures.  Objective: Vitals:   01/02/21 0525 01/02/21 0731  BP: (!) 85/69 99/72  Pulse: (!) 139 (!) 152  Resp: (!) 22 14  Temp: 98.6 F (37 C) 98 F (36.7 C)  SpO2: (!) 88% (!) 89%    Intake/Output Summary (Last 24 hours) at 01/02/2021 1236 Last data filed at 01/02/2021 1025 Gross per 24 hour  Intake -  Output 600 ml  Net -600 ml   Filed Weights   12/28/2020 2230 12/27/20 1540  Weight: 58 kg (P) 48.8 kg    ROS: Review of Systems  Unable to perform ROS: Acuity of condition   Exam: Physical Exam HENT:     Head: Normocephalic.     Mouth/Throat:     Comments: Unable to look into mouth Eyes:     General: Lids are normal.     Conjunctiva/sclera: Conjunctivae normal.     Comments: Pupils pinpoint  Cardiovascular:     Rate and Rhythm: Normal rate and regular rhythm.     Heart sounds: Normal heart sounds, S1 normal and S2 normal.  Pulmonary:     Breath sounds: Normal breath sounds. No decreased breath sounds, wheezing, rhonchi or rales.  Abdominal:     Palpations: Abdomen is soft.     Tenderness: There is no abdominal tenderness.  Musculoskeletal:     Right lower leg: No swelling.     Left lower leg: No swelling.  Skin:    General: Skin is warm.     Findings: No rash.  Neurological:     Mental Status: He is unresponsive.       Data Reviewed: Basic Metabolic Panel: Recent Labs  Lab 12/25/2020 1857 12/25/2020 2159 12/28/20 0441  NA 157*  --  148*  K 3.6  --  3.3*  CL 125*  --  116*  CO2 20*  --  24  GLUCOSE 125*  --  170*  BUN 61*  --  45*  CREATININE 1.64*  --  0.94  CALCIUM 7.9*  --  8.4*  MG  --  2.5*  --    Liver  Function Tests: Recent Labs  Lab 12/25/2020 1857  AST 33  ALT 15  ALKPHOS 51  BILITOT 1.2  PROT 6.0*  ALBUMIN 2.0*   CBC: Recent Labs  Lab 01/08/2021 1857 12/28/20 0441  WBC 12.2* 10.7*  NEUTROABS 9.7*  --   HGB 12.5* 11.1*  HCT 40.2 36.4*  MCV 100.2* 100.6*  PLT 114* 100*    CBG: Recent Labs  Lab 12/28/20 0803  GLUCAP 164*    Recent Results (from the past 240 hour(s))  Resp Panel by RT-PCR (Flu A&B, Covid) Nasopharyngeal Swab     Status: None   Collection Time: 12/17/2020  6:57 PM   Specimen: Nasopharyngeal Swab; Nasopharyngeal(NP) swabs in vial transport medium  Result Value Ref Range Status   SARS Coronavirus 2 by RT PCR NEGATIVE NEGATIVE Final    Comment: (NOTE) SARS-CoV-2 target nucleic acids are NOT DETECTED.  The SARS-CoV-2 RNA is generally detectable in upper respiratory specimens during the acute phase of infection. The lowest concentration of SARS-CoV-2 viral copies this assay can detect is 138 copies/mL. A negative result does  not preclude SARS-Cov-2 infection and should not be used as the sole basis for treatment or other patient management decisions. A negative result may occur with  improper specimen collection/handling, submission of specimen other than nasopharyngeal swab, presence of viral mutation(s) within the areas targeted by this assay, and inadequate number of viral copies(<138 copies/mL). A negative result must be combined with clinical observations, patient history, and epidemiological information. The expected result is Negative.  Fact Sheet for Patients:  EntrepreneurPulse.com.au  Fact Sheet for Healthcare Providers:  IncredibleEmployment.be  This test is no t yet approved or cleared by the [place] FDA and  has been authorized for detection and/or diagnosis of SARS-CoV-2 by FDA under an Emergency Use Authorization (EUA). This EUA will remain  in effect (meaning this test can be used) for the  duration of the COVID-19 declaration under Section 564(b)(1) of the Act, 21 U.S.C.section 360bbb-3(b)(1), unless the authorization is terminated  or revoked sooner.       Influenza A by PCR NEGATIVE NEGATIVE Final   Influenza B by PCR NEGATIVE NEGATIVE Final    Comment: (NOTE) The Xpert Xpress SARS-CoV-2/FLU/RSV plus assay is intended as an aid in the diagnosis of influenza from Nasopharyngeal swab specimens and should not be used as a sole basis for treatment. Nasal washings and aspirates are unacceptable for Xpert Xpress SARS-CoV-2/FLU/RSV testing.  Fact Sheet for Patients: EntrepreneurPulse.com.au  Fact Sheet for Healthcare Providers: IncredibleEmployment.be  This test is not yet approved or cleared by the [place] FDA and has been authorized for detection and/or diagnosis of SARS-CoV-2 by FDA under an Emergency Use Authorization (EUA). This EUA will remain in effect (meaning this test can be used) for the duration of the COVID-19 declaration under Section 564(b)(1) of the Act, 21 U.S.C. section 360bbb-3(b)(1), unless the authorization is terminated or revoked.  Performed at [facility], [address] [place], [place] [address]   Blood Culture (routine x 2)     Status: None   Collection Time: 12/28/2020  6:57 PM   Specimen: BLOOD  Result Value Ref Range Status   Specimen Description BLOOD BLOOD RIGHT WRIST  Final   Special Requests   Final    BOTTLES DRAWN AEROBIC AND ANAEROBIC Blood Culture results may not be optimal due to an inadequate volume of blood received in culture bottles   Culture   Final    NO GROWTH 5 DAYS Performed at [facility], [address], [place], [place] [address]    Report Status 12/31/2020 FINAL  Final  Blood Culture (routine x 2)     Status: None   Collection Time: 12/22/2020  6:57 PM   Specimen: BLOOD  Result Value Ref Range Status   Specimen Description BLOOD BLOOD LEFT WRIST   Final   Special Requests   Final    BOTTLES DRAWN AEROBIC AND ANAEROBIC Blood Culture results may not be optimal due to an inadequate volume of blood received in culture bottles   Culture   Final    NO GROWTH 5 DAYS Performed at [facility], [address], [place], [place] [address]    Report Status 12/31/2020 FINAL  Final  Urine culture     Status: Abnormal   Collection Time: 12/27/20  4:02 AM   Specimen: In/Out Cath Urine  Result Value Ref Range Status   Specimen Description   Final    IN/OUT CATH URINE Performed at [facility], [address] [place], [place] [address]    Special Requests  Final    NONE Performed at [facility], [place], [place] [address]    Culture 3,000 COLONIES/mL ENTEROCOCCUS FAECALIS (A)  Final   Report Status 12/29/2020 FINAL  Final   Organism ID, Bacteria ENTEROCOCCUS FAECALIS (A)  Final      Susceptibility   Enterococcus faecalis - MIC*    AMPICILLIN <=2 SENSITIVE Sensitive     NITROFURANTOIN <=16 SENSITIVE Sensitive     VANCOMYCIN 1 SENSITIVE Sensitive     * 3,000 COLONIES/mL ENTEROCOCCUS FAECALIS  MRSA PCR Screening     Status: None   Collection Time: 12/28/20  7:35 AM   Specimen: Nasopharyngeal  Result Value Ref Range Status   MRSA by PCR NEGATIVE NEGATIVE Final    Comment:        The GeneXpert MRSA Assay (FDA approved for NASAL specimens only), is one component of a comprehensive MRSA colonization surveillance program. It is not intended to diagnose MRSA infection nor to guide or monitor treatment for MRSA infections. Performed at [facility], [address] [place], [place] [address]       Scheduled Meds: Continuous Infusions: . morphine 4 mg/hr (01/02/21 1232)    Assessment/Plan:  1. End-of-life care.  Continue morphine drip at 4 mg/h.  Breathes a little bit more when stimulated.  Continue full comfort care measures.  Case discussed with palliative care.  No  hospice home beds today. 2. Severe sepsis, present on admission with lobar pneumonia, acute metabolic encephalopathy 3. Acute hypoxic respiratory failure.  Pulse ox 89% room air this morning.  During the hospital course required BiPAP during the hospital course prior to being made comfort care. 4. 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 Hypernatremia and hypokalemia 6. Intermittent SVT 7. Thrombocytopenia 8. 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  See description below.  Pressure Injury 12/27/20 Back Lateral;Upper;Left Stage 1 -  Intact skin with non-blanchable redness of a localized area usually over a bony prominence. (Active)  12/27/20 1636  Location: Back  Location Orientation: Lateral;Upper;Left  Staging: Stage 1 -  Intact skin with non-blanchable redness of a localized area usually over a bony prominence.  Wound Description (Comments):   Present on Admission: Yes     Pressure Injury 12/27/20 Coccyx Medial Stage 3 -  Full thickness tissue loss. Subcutaneous fat may be visible but bone, tendon or muscle are NOT exposed. (Active)  12/27/20 1637  Location: Coccyx  Location Orientation: Medial  Staging: Stage 3 -  Full thickness tissue loss. Subcutaneous fat may be visible but bone, tendon or muscle are NOT exposed.  Wound Description (Comments):   Present on Admission: Yes     Pressure Injury 12/27/20 Heel Right Deep Tissue Pressure Injury - Purple or maroon localized area of discolored intact skin or blood-filled blister due to damage of underlying soft tissue from pressure and/or shear. (Active)  12/27/20 1638  Location: Heel  Location Orientation: Right  Staging: Deep Tissue Pressure Injury - Purple or maroon localized area of discolored intact skin or blood-filled blister due to damage of underlying soft tissue from pressure and/or shear.  Wound Description (Comments):   Present on Admission: Yes     Pressure Injury 12/27/20 Throat Left  Deep Tissue Pressure Injury - Purple or maroon localized area of discolored intact skin or blood-filled blister due to damage of underlying soft tissue from pressure and/or shear. blister (Active)  12/27/20 1639  Location: Throat  Location Orientation: Left  Staging: Deep Tissue Pressure Injury -  Purple or maroon localized area of discolored intact skin or blood-filled blister due to damage of underlying soft tissue from pressure and/or shear.  Wound Description (Comments): blister  Present on Admission: Yes       Code Status:     Code Status Orders  (From admission, onward)         Start     Ordered   12/29/20 0938  Do not attempt resuscitation (DNR)  Continuous       Question Answer Comment  In the event of cardiac or respiratory ARREST Do not call a "code blue"   In the event of cardiac or respiratory ARREST Do not perform Intubation, CPR, defibrillation or ACLS   In the event of cardiac or respiratory ARREST Use medication by any route, position, wound care, and other measures to relive pain and suffering. May use oxygen, suction and manual treatment of airway obstruction as needed for comfort.   Comments nurse may pronounce      12/29/20 0938        Code Status History    Date Active Date Inactive Code Status Order ID Comments User Context   12/27/2020 2216 12/29/2020 0938 DNR [id number]  Criss Alvine, DO ED   12/31/2020 2038 12/22/2020 2216 DNR [id number]  Blake Divine, MD ED   12/16/2020 1903 12/29/2020 1909 DNR [id number]  Blake Divine, MD ED   11/06/2015 0349 11/09/2015 2027 Full Code [id number]  Theressa Millard, MD Inpatient   Advance Care Planning Activity     Family Communication: Palliative care spoke with friend today Disposition Plan: Status is: Inpatient  Dispo: The patient is from: Rehab              Anticipated d/c is to: Potential hospice home versus in hospital comfort care              Anticipated d/c date is: Potential hospice home versus in-hospital  comfort care              Patient currently on morphine drip.   Difficult to place patient.  No hospice home beds today.  Time spent: 25 minutes, case discussed with transitional care team and palliative care  [place]

## 2021-01-02 NOTE — Progress Notes (Signed)
Palliative:  Tyler Pierce is now full comfort measures.  He is lying quietly in bed, and does not respond in any meaningful way to voice or touch.  He is unable to make his basic needs known, but looks quite comfortable.  There are no family or friends at bedside.  Call to friend Tyler Pierce. We talk about comfort care, Tyler Pierce's condition this morning.  We talked about residential hospice for comfort and dignity.  Tyler Pierce states that she is agreeable to sign admitting paperwork for Tyler Pierce if she is allowed.  Conference with attending, bedside nursing staff, transition of care team related to patient condition, needs, comfort care.  Orders reviewed and adjusted.  Plan: Full comfort care.  Morphine continuous infusion for comfort.  Friend is agreeable to residential hospice with Tyler Pierce if she is allowed to sign admitting paperwork for him. Prognosis: Hours to days.  Too unstable for transport, anticipate hospital death.  31 minutes  Tyler Axe, NP Palliative medicine team Team phone [phone] Greater than 50% of this time was spent counseling and coordinating care related to the above assessment and plan.

## 2021-01-02 NOTE — Plan of Care (Signed)
  Problem: Education: Goal: Knowledge of General Education information will improve Description: Including pain rating scale, medication(s)/side effects and non-pharmacologic comfort measures Outcome: Not Progressing   Problem: Clinical Measurements: Goal: Ability to maintain clinical measurements within normal limits will improve Outcome: Not Progressing Goal: Will remain free from infection Outcome: Not Progressing Goal: Diagnostic test results will improve Outcome: Not Progressing Goal: Respiratory complications will improve Outcome: Not Progressing   Problem: Safety: Goal: Ability to remain free from injury will improve Outcome: Not Progressing   Problem: Skin Integrity: Goal: Risk for impaired skin integrity will decrease Outcome: Not Progressing    Pt is comfort care

## 2021-01-10 NOTE — Death Summary Note (Signed)
DEATH SUMMARY   Patient Details  Name: Tyler Pierce MRN: [id number] DOB: June 18, 1946  Admission/Discharge Information   Admit Date:  14-Jan-2021  Date of Death: Date of Death: 2021/01/22  Time of Death: Time of Death: 0555  Length of Stay: February 06, 2023  Referring Physician: Patient, No Pcp Per   Reason(s) for Hospitalization  Brought in with altered mental status and admitted for severe sepsis.  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 year old male who was admitted to the hospital with acute metabolic encephalopathy and severe sepsis secondary to pneumonia.  Patient was initially started on IV antibiotics.  The patient had worsening respiratory status and need to be placed on continuous BiPAP and transferred to the ICU.  The patient was unresponsive during this timeframe.  The patient does have a friend and we talked through goals of care.  With his underlying adenocarcinoma of the lung metastatic to the liver and brain and underlying brain herniation on CT scan, the decision was moved towards comfort care measures.  Palliative care also saw the patient.  The patient was started on morphine drip, comfort care measures on 12/29/2020 and  transferred out to the floor.  The patient passed away on 01/22/21 at 5:55 AM.  Diagnoses through the hospital course included severe sepsis, present on admission with lobar pneumonia, acute metabolic encephalopathy.  The patient also had acute hypoxic respiratory failure requiring BiPAP.  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  Patient also had hyponatremia and hypokalemia.  The patient had intermittent SVT during the hospital course requiring intermittent IV metoprolol.  Patient also had thrombocytopenia.  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    Pertinent Labs and Studies  Significant Diagnostic Studies CT Head Wo Contrast  Result Date: 2021-01-14 CLINICAL DATA:  Mental status change EXAM: CT HEAD WITHOUT CONTRAST TECHNIQUE: Contiguous axial images were obtained from the base of the skull through the vertex without intravenous contrast. COMPARISON:  CT brain 05/28/2020, MRI 11/06/2015 FINDINGS: Brain: Large left convexity extra-axial heterogenous mass measuring approximately 10.5 x 4.7 by 9.3 cm, previously 9.6 by 4.5 x 8.6 cm when measured in a similar fashion on previous exam. Mass slightly dense at its periphery with central low attenuation and scattered calcification. Increased midline shift to the right, measuring 18 mm, previously 15 mm. Persistent severe mass effect on the left lateral ventricle. Subfalcine herniation to the right as before. Significant local mass effect with surrounding edema as before. Mild left sulcal effacement consistent with swelling. Underlying chronic small vessel ischemic change of the white matter. Redemonstrated chronic lacunar infarcts  within the basal ganglia. Vascular: No hyperdense vessels.  Carotid vascular calcification. Skull: No fracture Sinuses/Orbits: No acute finding. Mild mucosal  thickening in the sinuses Other: None IMPRESSION: 1. Large left convexity extra-axial heterogenous mass, increased in size compared to most recent prior exam. Persistent severe local mass effect with subfalcine herniation as before; slight increased midline shift to the right, now 18 mm previously 15 mm. No definitive hemorrhage identified. 2. Atrophy and chronic small vessel ischemic changes of the white matter. Chronic lacunar infarcts within the basal ganglia. Critical Value/emergent results were called by telephone at the time of interpretation on 12/25/2020 at 8:13 pm to provider [facility] , who verbally acknowledged these results. Electronically Signed   By: Donavan Foil M.D.   On: 12/30/2020 20:14   DG Chest Port 1 View  Result Date: 12/20/2020 CLINICAL DATA:  Possible sepsis EXAM: PORTABLE CHEST 1 VIEW COMPARISON:  05/28/2020 FINDINGS: The heart size and mediastinal contours are within normal limits. Atherosclerotic calcification of the aortic knob. Streaky left perihilar and left basilar airspace opacity. Right lung is clear. No pleural effusion or pneumothorax. IMPRESSION: Streaky left perihilar and left basilar airspace opacity, suspicious for pneumonia and/or aspiration. Electronically Signed   By: Davina Poke D.O.   On: 12/21/2020 19:53    Microbiology Recent Results (from the past 240 hour(s))  Resp Panel by RT-PCR (Flu A&B, Covid) Nasopharyngeal Swab     Status: None   Collection Time: 12/30/2020  6:57 PM   Specimen: Nasopharyngeal Swab; Nasopharyngeal(NP) swabs in vial transport medium  Result Value Ref Range Status   SARS Coronavirus 2 by RT PCR NEGATIVE NEGATIVE Final    Comment: (NOTE) SARS-CoV-2 target nucleic acids are NOT DETECTED.  The SARS-CoV-2 RNA is generally detectable in upper respiratory specimens during the acute phase of infection. The lowest concentration of SARS-CoV-2 viral copies this assay can detect is 138 copies/mL. A negative result does not preclude  SARS-Cov-2 infection and should not be used as the sole basis for treatment or other patient management decisions. A negative result may occur with  improper specimen collection/handling, submission of specimen other than nasopharyngeal swab, presence of viral mutation(s) within the areas targeted by this assay, and inadequate number of viral copies(<138 copies/mL). A negative result must be combined with clinical observations, patient history, and epidemiological information. The expected result is Negative.  Fact Sheet for Patients:  EntrepreneurPulse.com.au  Fact Sheet for Healthcare Providers:  IncredibleEmployment.be  This test is no t yet approved or cleared by the [place] FDA and  has been authorized for detection and/or diagnosis of SARS-CoV-2 by FDA under an Emergency Use Authorization (EUA). This EUA will remain  in effect (meaning this test can be used) for the duration of the COVID-19 declaration under Section 564(b)(1) of the Act, 21 U.S.C.section 360bbb-3(b)(1), unless the authorization is terminated  or revoked sooner.       Influenza A by PCR NEGATIVE NEGATIVE Final   Influenza B by PCR NEGATIVE NEGATIVE Final    Comment: (NOTE) The Xpert Xpress SARS-CoV-2/FLU/RSV plus assay is intended as an aid in the diagnosis of influenza from Nasopharyngeal swab specimens and should not be used as a sole basis for treatment. Nasal washings and aspirates are unacceptable for Xpert Xpress SARS-CoV-2/FLU/RSV testing.  Fact Sheet for Patients: EntrepreneurPulse.com.au  Fact Sheet for Healthcare Providers: IncredibleEmployment.be  This test is not yet approved or cleared by the [place] FDA and has been authorized for detection and/or diagnosis of SARS-CoV-2 by FDA under an  Emergency Use Authorization (EUA). This EUA will remain in effect (meaning this test can be used) for the duration of  the COVID-19 declaration under Section 564(b)(1) of the Act, 21 U.S.C. section 360bbb-3(b)(1), unless the authorization is terminated or revoked.  Performed at [facility], [address] [place], [place] [address]   Blood Culture (routine x 2)     Status: None   Collection Time: 12/25/2020  6:57 PM   Specimen: BLOOD  Result Value Ref Range Status   Specimen Description BLOOD BLOOD RIGHT WRIST  Final   Special Requests   Final    BOTTLES DRAWN AEROBIC AND ANAEROBIC Blood Culture results may not be optimal due to an inadequate volume of blood received in culture bottles   Culture   Final    NO GROWTH 5 DAYS Performed at [facility], [address] [place], [place] [address]    Report Status 12/31/2020 FINAL  Final  Blood Culture (routine x 2)     Status: None   Collection Time: 12/18/2020  6:57 PM   Specimen: BLOOD  Result Value Ref Range Status   Specimen Description BLOOD BLOOD LEFT WRIST  Final   Special Requests   Final    BOTTLES DRAWN AEROBIC AND ANAEROBIC Blood Culture results may not be optimal due to an inadequate volume of blood received in culture bottles   Culture   Final    NO GROWTH 5 DAYS Performed at [facility], [address] [place], [place] [address]    Report Status 12/31/2020 FINAL  Final  Urine culture     Status: Abnormal   Collection Time: 12/27/20  4:02 AM   Specimen: In/Out Cath Urine  Result Value Ref Range Status   Specimen Description   Final    IN/OUT CATH URINE Performed at [facility], [address] [place], [place] [address]    Special Requests   Final    NONE Performed at [facility], [place], [place] [address]    Culture 3,000 COLONIES/mL ENTEROCOCCUS FAECALIS (A)  Final   Report Status 12/29/2020 FINAL  Final   Organism ID, Bacteria ENTEROCOCCUS FAECALIS (A)  Final      Susceptibility   Enterococcus faecalis - MIC*    AMPICILLIN <=2 SENSITIVE Sensitive      NITROFURANTOIN <=16 SENSITIVE Sensitive     VANCOMYCIN 1 SENSITIVE Sensitive     * 3,000 COLONIES/mL ENTEROCOCCUS FAECALIS  MRSA PCR Screening     Status: None   Collection Time: 12/28/20  7:35 AM   Specimen: Nasopharyngeal  Result Value Ref Range Status   MRSA by PCR NEGATIVE NEGATIVE Final    Comment:        The GeneXpert MRSA Assay (FDA approved for NASAL specimens only), is one component of a comprehensive MRSA colonization surveillance program. It is not intended to diagnose MRSA infection nor to guide or monitor treatment for MRSA infections. Performed at [facility], [address] [place], [place] [address]     Lab Basic Metabolic Panel: Recent Labs  Lab 12/28/20 0441  NA 148*  K 3.3*  CL 116*  CO2 24  GLUCOSE 170*  BUN 45*  CREATININE 0.94  CALCIUM 8.4*   CBC: Recent Labs  Lab 12/28/20 0441  WBC 10.7*  HGB 11.1*  HCT 36.4*  MCV 100.6*  PLT 100*   Sepsis Labs: Recent Labs  Lab 12/28/20 0441  WBC 10.7*     Ronelle Smallman 01/13/21, 7:04  PM

## 2021-01-10 DEATH — deceased
